# Patient Record
Sex: Female | Born: 1970 | Race: Black or African American | Hispanic: No | Marital: Married | Smoking: Never smoker
Health system: Southern US, Community
[De-identification: ages and names within clinical notes are randomized; demographics above are authoritative.]

## PROBLEM LIST (undated history)

## (undated) DIAGNOSIS — I10 Essential (primary) hypertension: Secondary | ICD-10-CM

## (undated) DIAGNOSIS — K219 Gastro-esophageal reflux disease without esophagitis: Secondary | ICD-10-CM

## (undated) DIAGNOSIS — Z8719 Personal history of other diseases of the digestive system: Secondary | ICD-10-CM

## (undated) HISTORY — PX: WISDOM TOOTH EXTRACTION: SHX21

## (undated) HISTORY — PX: DILATION AND CURETTAGE OF UTERUS: SHX78

## (undated) HISTORY — PX: BUNIONECTOMY: SHX129

---

## 1999-07-18 ENCOUNTER — Other Ambulatory Visit: Admission: RE | Admit: 1999-07-18 | Discharge: 1999-07-18 | Payer: Self-pay | Admitting: Internal Medicine

## 1999-09-03 ENCOUNTER — Encounter: Payer: Self-pay | Admitting: Internal Medicine

## 1999-09-03 ENCOUNTER — Ambulatory Visit (HOSPITAL_COMMUNITY): Admission: RE | Admit: 1999-09-03 | Discharge: 1999-09-03 | Payer: Self-pay | Admitting: Internal Medicine

## 2000-03-07 ENCOUNTER — Emergency Department (HOSPITAL_COMMUNITY): Admission: EM | Admit: 2000-03-07 | Discharge: 2000-03-07 | Payer: Self-pay | Admitting: Emergency Medicine

## 2000-03-19 ENCOUNTER — Encounter: Admission: RE | Admit: 2000-03-19 | Discharge: 2000-06-17 | Payer: Self-pay | Admitting: Internal Medicine

## 2000-03-29 ENCOUNTER — Ambulatory Visit (HOSPITAL_COMMUNITY): Admission: RE | Admit: 2000-03-29 | Discharge: 2000-03-29 | Payer: Self-pay | Admitting: Orthopedic Surgery

## 2000-03-29 ENCOUNTER — Encounter: Payer: Self-pay | Admitting: Orthopedic Surgery

## 2000-05-28 ENCOUNTER — Emergency Department (HOSPITAL_COMMUNITY): Admission: EM | Admit: 2000-05-28 | Discharge: 2000-05-28 | Payer: Self-pay | Admitting: Internal Medicine

## 2000-07-01 ENCOUNTER — Ambulatory Visit (HOSPITAL_COMMUNITY): Admission: RE | Admit: 2000-07-01 | Discharge: 2000-07-01 | Payer: Self-pay | Admitting: Orthopedic Surgery

## 2000-07-01 ENCOUNTER — Encounter: Payer: Self-pay | Admitting: Orthopedic Surgery

## 2000-07-31 ENCOUNTER — Ambulatory Visit (HOSPITAL_COMMUNITY): Admission: RE | Admit: 2000-07-31 | Discharge: 2000-07-31 | Payer: Self-pay | Admitting: Orthopedic Surgery

## 2000-07-31 ENCOUNTER — Encounter: Payer: Self-pay | Admitting: Orthopedic Surgery

## 2000-08-17 ENCOUNTER — Other Ambulatory Visit: Admission: RE | Admit: 2000-08-17 | Discharge: 2000-09-04 | Payer: Self-pay | Admitting: Family Medicine

## 2000-10-07 ENCOUNTER — Emergency Department (HOSPITAL_COMMUNITY): Admission: EM | Admit: 2000-10-07 | Discharge: 2000-10-07 | Payer: Self-pay | Admitting: Emergency Medicine

## 2000-10-20 ENCOUNTER — Encounter: Payer: Self-pay | Admitting: Emergency Medicine

## 2000-10-20 ENCOUNTER — Emergency Department (HOSPITAL_COMMUNITY): Admission: EM | Admit: 2000-10-20 | Discharge: 2000-10-20 | Payer: Self-pay | Admitting: Emergency Medicine

## 2001-01-14 ENCOUNTER — Emergency Department (HOSPITAL_COMMUNITY): Admission: EM | Admit: 2001-01-14 | Discharge: 2001-01-14 | Payer: Self-pay | Admitting: Emergency Medicine

## 2001-02-15 ENCOUNTER — Encounter: Payer: Self-pay | Admitting: Emergency Medicine

## 2001-02-15 ENCOUNTER — Emergency Department (HOSPITAL_COMMUNITY): Admission: EM | Admit: 2001-02-15 | Discharge: 2001-02-15 | Payer: Self-pay | Admitting: Emergency Medicine

## 2001-04-03 ENCOUNTER — Encounter: Payer: Self-pay | Admitting: Emergency Medicine

## 2001-04-03 ENCOUNTER — Emergency Department (HOSPITAL_COMMUNITY): Admission: EM | Admit: 2001-04-03 | Discharge: 2001-04-03 | Payer: Self-pay | Admitting: Emergency Medicine

## 2001-08-17 ENCOUNTER — Other Ambulatory Visit: Admission: RE | Admit: 2001-08-17 | Discharge: 2001-08-17 | Payer: Self-pay | Admitting: Internal Medicine

## 2002-11-10 ENCOUNTER — Emergency Department (HOSPITAL_COMMUNITY): Admission: EM | Admit: 2002-11-10 | Discharge: 2002-11-11 | Payer: Self-pay | Admitting: Emergency Medicine

## 2003-02-09 ENCOUNTER — Other Ambulatory Visit: Admission: RE | Admit: 2003-02-09 | Discharge: 2003-02-09 | Payer: Self-pay | Admitting: Family Medicine

## 2004-03-26 ENCOUNTER — Emergency Department (HOSPITAL_COMMUNITY): Admission: EM | Admit: 2004-03-26 | Discharge: 2004-03-26 | Payer: Self-pay | Admitting: *Deleted

## 2004-05-24 ENCOUNTER — Emergency Department (HOSPITAL_COMMUNITY): Admission: EM | Admit: 2004-05-24 | Discharge: 2004-05-24 | Payer: Self-pay | Admitting: Emergency Medicine

## 2005-12-01 HISTORY — PX: LAPAROTOMY: SHX154

## 2010-04-10 ENCOUNTER — Encounter: Admission: RE | Admit: 2010-04-10 | Discharge: 2010-04-10 | Payer: Self-pay | Admitting: Family Medicine

## 2012-11-07 ENCOUNTER — Ambulatory Visit (HOSPITAL_BASED_OUTPATIENT_CLINIC_OR_DEPARTMENT_OTHER): Payer: BC Managed Care – PPO | Attending: Family Medicine

## 2012-11-07 VITALS — Ht 68.0 in | Wt 216.0 lb

## 2012-11-07 DIAGNOSIS — G4733 Obstructive sleep apnea (adult) (pediatric): Secondary | ICD-10-CM | POA: Insufficient documentation

## 2012-11-13 DIAGNOSIS — G4761 Periodic limb movement disorder: Secondary | ICD-10-CM

## 2012-11-13 DIAGNOSIS — R0609 Other forms of dyspnea: Secondary | ICD-10-CM

## 2012-11-13 DIAGNOSIS — R0989 Other specified symptoms and signs involving the circulatory and respiratory systems: Secondary | ICD-10-CM

## 2012-11-13 DIAGNOSIS — G4733 Obstructive sleep apnea (adult) (pediatric): Secondary | ICD-10-CM

## 2012-11-13 NOTE — Procedures (Signed)
NAMECHEILA, Amy Morrison              ACCOUNT NO.:  [id number]  MEDICAL RECORD NO.:  [id number]          PATIENT TYPE:  OUT  LOCATION:  SLEEP CENTER                 FACILITY:  [facility]  PHYSICIAN:  Thelbert Gartin D. Maple Hudson, MD, FCCP, FACPDATE OF BIRTH:  03/05/1972  DATE OF STUDY:  11/07/2012                           NOCTURNAL POLYSOMNOGRAM  REFERRING PHYSICIAN:  Tanya D. Daphine Deutscher, M.D.  INDICATION FOR STUDY:  Hypersomnia with sleep apnea.  EPWORTH SLEEPINESS SCORE:  7/24.  BMI 33.  Weight 216 pounds.  Height 68 inches.  Neck 15 inches.  MEDICATIONS:  Home medications are charted and reviewed.  SLEEP ARCHITECTURE:  Total sleep time 363.5 minutes with sleep efficiency 83.7%.  Stage I was 5.2%, stage II 68.5%, stage III 7.7%, REM 18.6% of total sleep time.  Sleep latency 46.5 minutes.  REM latency 103 minutes.  Awake after sleep onset 25 minutes.  Arousal index 27.6.  BEDTIME MEDICATION:  None.  RESPIRATORY DATA:  Apnea-hypopnea index (AHI) 41.6 per hour.  A total of 252 events was scored including 201 obstructive apneas, 1 central apnea, 8 mixed apneas, 42 hypopneas.  Events were more common while supine, but seen in all sleep positions.  REM AHI 44.4 per hour.  There were insufficient numbers of early events to permit application of split protocol, CPAP titration on the study night.  OXYGEN DATA:  Very loud snoring with oxygen desaturation to a nadir of 86% and mean oxygen saturation through the study of 94.1% on room air.  CARDIAC DATA:  Normal sinus rhythm.  MOVEMENT-PARASOMNIA:  A total of 17 limb jerks were counted, of which 7 were associated with arousals or awakenings for periodic limb movement with arousal index of 1.2 per hour.  No bathroom trips.  IMPRESSIONS-RECOMMENDATIONS: 1. Severe obstructive sleep apnea/hypopnea syndrome, apnea-hypopnea index of     41.6 per hour.  Non-positional events.  Very loud snoring with     oxygen desaturation to a nadir of 86% and mean oxygen  saturation     through the study of 94.1% on room air. 2. There were insufficient numbers of early events to meet protocol     requirements for application of split     protocol, continuous positive airway pressure titration.  Consider     return for dedicated continuous positive airway pressure titration     study if appropriate.     Kendre Jacinto D. Maple Hudson, MD, [facility], FACP Diplomate, American Board of Sleep Medicine    CDY/MEDQ  D:  11/13/2012 11:14:14  T:  11/13/2012 12:21:25  Job:  [id number]

## 2013-01-28 ENCOUNTER — Other Ambulatory Visit: Payer: Self-pay | Admitting: Family Medicine

## 2013-01-28 ENCOUNTER — Ambulatory Visit
Admission: RE | Admit: 2013-01-28 | Discharge: 2013-01-28 | Disposition: A | Discharge status: Home / Self Care | Payer: BC Managed Care – PPO | Source: Ambulatory Visit | Attending: Family Medicine | Admitting: Family Medicine

## 2013-01-28 DIAGNOSIS — M25561 Pain in right knee: Secondary | ICD-10-CM

## 2013-01-28 DIAGNOSIS — R609 Edema, unspecified: Secondary | ICD-10-CM

## 2013-04-28 ENCOUNTER — Other Ambulatory Visit: Payer: Self-pay | Admitting: Obstetrics and Gynecology

## 2013-04-28 DIAGNOSIS — Z1231 Encounter for screening mammogram for malignant neoplasm of breast: Secondary | ICD-10-CM

## 2013-05-06 ENCOUNTER — Ambulatory Visit (HOSPITAL_COMMUNITY)
Admission: RE | Admit: 2013-05-06 | Discharge: 2013-05-06 | Disposition: A | Discharge status: Home / Self Care | Payer: BC Managed Care – PPO | Source: Ambulatory Visit | Attending: Obstetrics and Gynecology | Admitting: Obstetrics and Gynecology

## 2013-05-06 DIAGNOSIS — Z1231 Encounter for screening mammogram for malignant neoplasm of breast: Secondary | ICD-10-CM | POA: Insufficient documentation

## 2013-06-01 ENCOUNTER — Ambulatory Visit: Payer: BC Managed Care – PPO

## 2013-11-08 ENCOUNTER — Other Ambulatory Visit: Payer: Self-pay | Admitting: Obstetrics and Gynecology

## 2013-11-08 DIAGNOSIS — IMO0002 Reserved for concepts with insufficient information to code with codable children: Secondary | ICD-10-CM

## 2013-11-15 ENCOUNTER — Ambulatory Visit (HOSPITAL_COMMUNITY)
Admission: RE | Admit: 2013-11-15 | Discharge: 2013-11-15 | Disposition: A | Discharge status: Home / Self Care | Payer: BC Managed Care – PPO | Source: Ambulatory Visit | Attending: Obstetrics and Gynecology | Admitting: Obstetrics and Gynecology

## 2013-11-15 DIAGNOSIS — D259 Leiomyoma of uterus, unspecified: Secondary | ICD-10-CM | POA: Insufficient documentation

## 2013-11-15 DIAGNOSIS — IMO0002 Reserved for concepts with insufficient information to code with codable children: Secondary | ICD-10-CM

## 2013-11-15 DIAGNOSIS — N979 Female infertility, unspecified: Secondary | ICD-10-CM | POA: Insufficient documentation

## 2013-11-15 MED ORDER — IOHEXOL 300 MG/ML  SOLN
20.0000 mL | Freq: Once | INTRAMUSCULAR | Status: AC | PRN
  Administered 2013-11-15: 20 mL

## 2014-08-03 ENCOUNTER — Other Ambulatory Visit (HOSPITAL_COMMUNITY): Payer: Self-pay | Admitting: Family Medicine

## 2014-08-03 DIAGNOSIS — Z1231 Encounter for screening mammogram for malignant neoplasm of breast: Secondary | ICD-10-CM

## 2014-08-11 ENCOUNTER — Ambulatory Visit (HOSPITAL_COMMUNITY)
Admission: RE | Admit: 2014-08-11 | Discharge: 2014-08-11 | Disposition: A | Discharge status: Home / Self Care | Payer: BC Managed Care – PPO | Source: Ambulatory Visit | Attending: Family Medicine | Admitting: Family Medicine

## 2014-08-11 DIAGNOSIS — Z1231 Encounter for screening mammogram for malignant neoplasm of breast: Secondary | ICD-10-CM | POA: Diagnosis present

## 2014-12-05 ENCOUNTER — Other Ambulatory Visit: Payer: Self-pay | Admitting: Obstetrics and Gynecology

## 2014-12-06 NOTE — H&P (Signed)
   Reason for admission:   Sub mucosal fibroids   History:     Amy Morrison is a 44 y.o. female, G0., presenting today to undergo hysteroscopic myomectomy. Patient is seeking pregnancy with a normal AMH, appropriately corrected anovulation with Clomid 150 mg  And female infertility under the care of a urologist. She has failed her 1st IUI and recently had a saline infusion sonogram Which revealed 4 submucosal fibroids with the largest being fundal and 3.1 cm   Review of system:  Non-contributory   Past Medical History:   Hypertension now on HCTZ 25 mg only ( recently d/c Procardia) Morbid obesity with BMI 46 Myomectomy 2007: 10 fibroids removed  Allergies:   NKDA  Social History:    Married and non-smoker. Works as a Holiday representative  Family History:   Maternal and paternal grandparents with hypertension Mother with diabetes Father with colon and prostate cancer  Physical exam:    General Appearance: Alert, appropriate appearance for age. No acute distress Neck / Thyroid: Supple, no masses, nodes or enlargement Lungs: clear to auscultation bilaterally Back: No CVA tenderness. Cardiovascular: Regular rate and rhythm. S1, S2, no murmur Gastrointestinal: Soft, non-tender, no masses or organomegaly Pelvic Exam: Vulva and vagina appear normal. Bimanual exam reveals normal uterus and adnexa.   Assessment:   Submucosal fibroids in a patient pursuing pregnancy   Plan:    Proceed with hysteroscopic myomectomy Procedure, risks and benefits reviewed with patient including but not limited to bleeding, infection, uterine perforation with possible intraabdominal organ damage and need for laparotomy. Patient voices understanding and is agreeable to proceed.      Delsa Bern A MD

## 2014-12-14 ENCOUNTER — Other Ambulatory Visit: Payer: Self-pay | Admitting: Obstetrics and Gynecology

## 2014-12-18 ENCOUNTER — Encounter (HOSPITAL_COMMUNITY): Payer: Self-pay

## 2014-12-18 ENCOUNTER — Other Ambulatory Visit: Payer: Self-pay

## 2014-12-18 ENCOUNTER — Encounter (HOSPITAL_COMMUNITY)
Admission: RE | Admit: 2014-12-18 | Discharge: 2014-12-18 | Disposition: A | Discharge status: Home / Self Care | Payer: BLUE CROSS/BLUE SHIELD | Source: Ambulatory Visit | Attending: Obstetrics and Gynecology | Admitting: Obstetrics and Gynecology

## 2014-12-18 DIAGNOSIS — Z01818 Encounter for other preprocedural examination: Secondary | ICD-10-CM | POA: Insufficient documentation

## 2014-12-18 DIAGNOSIS — D250 Submucous leiomyoma of uterus: Secondary | ICD-10-CM | POA: Diagnosis not present

## 2014-12-18 HISTORY — DX: Gastro-esophageal reflux disease without esophagitis: K21.9

## 2014-12-18 HISTORY — DX: Essential (primary) hypertension: I10

## 2014-12-18 LAB — CBC
HEMATOCRIT: 38.3 % (ref 36.0–46.0)
Hemoglobin: 12.8 g/dL (ref 12.0–15.0)
MCH: 26 pg (ref 26.0–34.0)
MCHC: 33.4 g/dL (ref 30.0–36.0)
MCV: 77.8 fL — AB (ref 78.0–100.0)
PLATELETS: 214 10*3/uL (ref 150–400)
RBC: 4.92 MIL/uL (ref 3.87–5.11)
RDW: 15.1 % (ref 11.5–15.5)
WBC: 5.3 10*3/uL (ref 4.0–10.5)

## 2014-12-18 LAB — BASIC METABOLIC PANEL
ANION GAP: 6 (ref 5–15)
BUN: 9 mg/dL (ref 6–23)
CHLORIDE: 106 meq/L (ref 96–112)
CO2: 29 mmol/L (ref 19–32)
CREATININE: 0.9 mg/dL (ref 0.50–1.10)
Calcium: 9.1 mg/dL (ref 8.4–10.5)
GFR calc Af Amer: >90 mL/min (ref >90)
GFR calc non Af Amer: 78 mL/min — ABNORMAL LOW (ref >90)
GLUCOSE: 109 mg/dL — AB (ref 70–99)
Potassium: 4 mmol/L (ref 3.5–5.1)
SODIUM: 141 mmol/L (ref 135–145)

## 2014-12-18 LAB — TYPE AND SCREEN
ABO/RH(D): A POS
ANTIBODY SCREEN: NEGATIVE

## 2014-12-18 LAB — ABO/RH: ABO/RH(D): A POS

## 2014-12-18 NOTE — Patient Instructions (Addendum)
   Your procedure is scheduled on:  Friday, Jan 22  Enter through the Main Entrance of [facility] at: 7:30 AM Pick up the phone at the desk and dial [phone] and inform us of your arrival.  Please call this number if you have any problems the morning of surgery: [phone]  Remember: Do not eat or drink after midnight: Thursday Take these medicines the morning of surgery with a SIP OF WATER: ranitidine, triam/hctz   Do not wear jewelry, make-up, or FINGER nail polish No metal in your hair or on your body. Do not wear lotions, powders, perfumes.  You may wear deodorant.  Do not bring valuables to the hospital. Contacts, dentures or bridgework may not be worn into surgery.  Patients discharged on the day of surgery will not be allowed to drive home.  Home with husband Marchia Bond cell [phone].

## 2014-12-21 MED ORDER — DEXTROSE 5 % IV SOLN
3.0000 g | INTRAVENOUS | Status: AC
  Administered 2014-12-22: 3 g via INTRAVENOUS
  Filled 2014-12-21: qty 3000

## 2014-12-22 ENCOUNTER — Ambulatory Visit (HOSPITAL_COMMUNITY): Payer: BLUE CROSS/BLUE SHIELD | Admitting: Certified Registered Nurse Anesthetist

## 2014-12-22 ENCOUNTER — Encounter (HOSPITAL_COMMUNITY): Payer: Self-pay | Admitting: Certified Registered Nurse Anesthetist

## 2014-12-22 ENCOUNTER — Ambulatory Visit (HOSPITAL_COMMUNITY)
Admission: RE | Admit: 2014-12-22 | Discharge: 2014-12-22 | Disposition: A | Discharge status: Home / Self Care | Payer: BLUE CROSS/BLUE SHIELD | Source: Ambulatory Visit | Attending: Obstetrics and Gynecology | Admitting: Obstetrics and Gynecology

## 2014-12-22 ENCOUNTER — Encounter (HOSPITAL_COMMUNITY)
Admission: RE | Disposition: A | Discharge status: Home / Self Care | Payer: Self-pay | Source: Ambulatory Visit | Attending: Obstetrics and Gynecology

## 2014-12-22 DIAGNOSIS — Z6841 Body Mass Index (BMI) 40.0 and over, adult: Secondary | ICD-10-CM | POA: Diagnosis not present

## 2014-12-22 DIAGNOSIS — I10 Essential (primary) hypertension: Secondary | ICD-10-CM | POA: Insufficient documentation

## 2014-12-22 DIAGNOSIS — D250 Submucous leiomyoma of uterus: Secondary | ICD-10-CM | POA: Diagnosis present

## 2014-12-22 DIAGNOSIS — N840 Polyp of corpus uteri: Secondary | ICD-10-CM | POA: Diagnosis not present

## 2014-12-22 DIAGNOSIS — K219 Gastro-esophageal reflux disease without esophagitis: Secondary | ICD-10-CM | POA: Diagnosis not present

## 2014-12-22 HISTORY — PX: DILATATION & CURETTAGE/HYSTEROSCOPY WITH MYOSURE: SHX6511

## 2014-12-22 LAB — PREGNANCY, URINE: Preg Test, Ur: NEGATIVE

## 2014-12-22 SURGERY — DILATATION & CURETTAGE/HYSTEROSCOPY WITH MYOSURE
Anesthesia: General | Site: Uterus

## 2014-12-22 MED ORDER — ONDANSETRON HCL 4 MG/2ML IJ SOLN
INTRAMUSCULAR | Status: DC | PRN
  Administered 2014-12-22: 4 mg via INTRAVENOUS

## 2014-12-22 MED ORDER — FENTANYL CITRATE 0.05 MG/ML IJ SOLN
INTRAMUSCULAR | Status: DC | PRN
  Administered 2014-12-22: 50 ug via INTRAVENOUS
  Administered 2014-12-22 (×2): 25 ug via INTRAVENOUS

## 2014-12-22 MED ORDER — PROPOFOL 10 MG/ML IV BOLUS
INTRAVENOUS | Status: AC
  Filled 2014-12-22: qty 20

## 2014-12-22 MED ORDER — KETOROLAC TROMETHAMINE 30 MG/ML IJ SOLN
INTRAMUSCULAR | Status: DC | PRN
  Administered 2014-12-22: 30 mg via INTRAVENOUS

## 2014-12-22 MED ORDER — HYDROCODONE-ACETAMINOPHEN 5-300 MG PO TABS: 1.0000 | ORAL_TABLET | Freq: Four times a day (QID) | ORAL | Status: DC | PRN

## 2014-12-22 MED ORDER — HYDROMORPHONE HCL 1 MG/ML IJ SOLN
INTRAMUSCULAR | Status: AC
  Filled 2014-12-22: qty 1

## 2014-12-22 MED ORDER — MIDAZOLAM HCL 2 MG/2ML IJ SOLN
INTRAMUSCULAR | Status: AC
  Filled 2014-12-22: qty 2

## 2014-12-22 MED ORDER — DEXAMETHASONE SODIUM PHOSPHATE 10 MG/ML IJ SOLN
INTRAMUSCULAR | Status: DC | PRN
  Administered 2014-12-22: 4 mg via INTRAVENOUS

## 2014-12-22 MED ORDER — LIDOCAINE HCL (CARDIAC) 20 MG/ML IV SOLN
INTRAVENOUS | Status: AC
  Filled 2014-12-22: qty 5

## 2014-12-22 MED ORDER — HYDROMORPHONE HCL 1 MG/ML IJ SOLN
INTRAMUSCULAR | Status: DC | PRN
  Administered 2014-12-22 (×2): 0.5 mg via INTRAVENOUS

## 2014-12-22 MED ORDER — SCOPOLAMINE 1 MG/3DAYS TD PT72
1.0000 | MEDICATED_PATCH | Freq: Once | TRANSDERMAL | Status: DC
  Administered 2014-12-22: 1.5 mg via TRANSDERMAL

## 2014-12-22 MED ORDER — IBUPROFEN 600 MG PO TABS: 600.0000 mg | ORAL_TABLET | Freq: Four times a day (QID) | ORAL | Status: DC | PRN

## 2014-12-22 MED ORDER — PROPOFOL 10 MG/ML IV BOLUS
INTRAVENOUS | Status: DC | PRN
  Administered 2014-12-22: 180 mg via INTRAVENOUS

## 2014-12-22 MED ORDER — CHLOROPROCAINE HCL 1 % IJ SOLN
INTRAMUSCULAR | Status: AC
  Filled 2014-12-22: qty 30

## 2014-12-22 MED ORDER — ONDANSETRON HCL 4 MG/2ML IJ SOLN
INTRAMUSCULAR | Status: AC
Start: 2014-12-22 — End: 2014-12-22
  Filled 2014-12-22: qty 2

## 2014-12-22 MED ORDER — CHLOROPROCAINE HCL 1 % IJ SOLN
INTRAMUSCULAR | Status: DC | PRN
  Administered 2014-12-22: 20 mL

## 2014-12-22 MED ORDER — SCOPOLAMINE 1 MG/3DAYS TD PT72
MEDICATED_PATCH | TRANSDERMAL | Status: AC
  Administered 2014-12-22: 1.5 mg via TRANSDERMAL
  Filled 2014-12-22: qty 1

## 2014-12-22 MED ORDER — LIDOCAINE HCL (CARDIAC) 20 MG/ML IV SOLN
INTRAVENOUS | Status: DC | PRN
  Administered 2014-12-22: 80 mg via INTRAVENOUS

## 2014-12-22 MED ORDER — LACTATED RINGERS IV SOLN
INTRAVENOUS | Status: DC
Start: 2014-12-22 — End: 2014-12-22
  Administered 2014-12-22 (×2): via INTRAVENOUS

## 2014-12-22 MED ORDER — HYDROCODONE-ACETAMINOPHEN 5-325 MG PO TABS
1.0000 | ORAL_TABLET | Freq: Once | ORAL | Status: AC
  Administered 2014-12-22: 1 via ORAL

## 2014-12-22 MED ORDER — KETOROLAC TROMETHAMINE 30 MG/ML IJ SOLN
INTRAMUSCULAR | Status: AC
  Filled 2014-12-22: qty 1

## 2014-12-22 MED ORDER — DEXAMETHASONE SODIUM PHOSPHATE 4 MG/ML IJ SOLN
INTRAMUSCULAR | Status: AC
  Filled 2014-12-22: qty 1

## 2014-12-22 MED ORDER — MIDAZOLAM HCL 2 MG/2ML IJ SOLN
INTRAMUSCULAR | Status: DC | PRN
  Administered 2014-12-22: 2 mg via INTRAVENOUS

## 2014-12-22 MED ORDER — FENTANYL CITRATE 0.05 MG/ML IJ SOLN
INTRAMUSCULAR | Status: AC
  Filled 2014-12-22: qty 2

## 2014-12-22 MED ORDER — HYDROCODONE-ACETAMINOPHEN 5-325 MG PO TABS
ORAL_TABLET | ORAL | Status: AC
  Filled 2014-12-22: qty 1

## 2014-12-22 SURGICAL SUPPLY — 19 items
BOOTIES KNEE HIGH SLOAN (MISCELLANEOUS) ×6 IMPLANT
CANISTER SUCT 3000ML (MISCELLANEOUS) ×3 IMPLANT
CATH ROBINSON RED A/P 16FR (CATHETERS) ×3 IMPLANT
CLOTH BEACON ORANGE TIMEOUT ST (SAFETY) ×3 IMPLANT
CONTAINER PREFILL 10% NBF 60ML (FORM) ×6 IMPLANT
ELECT REM PT RETURN 9FT ADLT (ELECTROSURGICAL) ×3
ELECTRODE REM PT RTRN 9FT ADLT (ELECTROSURGICAL) ×1 IMPLANT
GLOVE BIOGEL PI IND STRL 7.0 (GLOVE) ×1 IMPLANT
GLOVE BIOGEL PI INDICATOR 7.0 (GLOVE) ×8
GLOVE SURG SS PI 7.0 STRL IVOR (GLOVE) ×6 IMPLANT
GOWN STRL REUS W/TWL LRG LVL3 (GOWN DISPOSABLE) ×6 IMPLANT
MYOSURE XL FIBROID REM (MISCELLANEOUS) ×3
PACK VAGINAL MINOR WOMEN LF (CUSTOM PROCEDURE TRAY) ×3 IMPLANT
PAD OB MATERNITY 4.3X12.25 (PERSONAL CARE ITEMS) ×3 IMPLANT
SYSTEM TISS REMOVAL MYSR XL RM (MISCELLANEOUS) IMPLANT
TOWEL OR 17X24 6PK STRL BLUE (TOWEL DISPOSABLE) ×3 IMPLANT
TUBING AQUILEX INFLOW (TUBING) ×3 IMPLANT
TUBING AQUILEX OUTFLOW (TUBING) ×3 IMPLANT
WATER STERILE IRR 1000ML POUR (IV SOLUTION) ×3 IMPLANT

## 2014-12-22 NOTE — Anesthesia Preprocedure Evaluation (Addendum)
Anesthesia Evaluation  Patient identified by MRN, date of birth, ID band Patient awake    Reviewed: Allergy & Precautions, H&P , Patient's Chart, lab work & pertinent test results, reviewed documented beta blocker date and time   History of Anesthesia Complications Negative for: history of anesthetic complications  Airway Mallampati: III  TM Distance: >3 FB Neck ROM: full    Dental   Pulmonary  breath sounds clear to auscultation        Cardiovascular Exercise Tolerance: Good hypertension, Rhythm:regular Rate:Normal     Neuro/Psych negative psych ROS   GI/Hepatic GERD-  Controlled,  Endo/Other    Renal/GU      Musculoskeletal   Abdominal   Peds  Hematology   Anesthesia Other Findings   Reproductive/Obstetrics                            Anesthesia Physical Anesthesia Plan  ASA: III  Anesthesia Plan: General LMA   Post-op Pain Management:    Induction:   Airway Management Planned:   Additional Equipment:   Intra-op Plan:   Post-operative Plan:   Informed Consent: I have reviewed the patients History and Physical, chart, labs and discussed the procedure including the risks, benefits and alternatives for the proposed anesthesia with the patient or authorized representative who has indicated his/her understanding and acceptance.   Dental Advisory Given  Plan Discussed with: CRNA, Surgeon and Anesthesiologist  Anesthesia Plan Comments:        Anesthesia Quick Evaluation

## 2014-12-22 NOTE — Transfer of Care (Signed)
Immediate Anesthesia Transfer of Care Note  Patient: Amy Morrison  Procedure(s) Performed: Procedure(s) with comments: DILATATION & CURETTAGE/HYSTEROSCOPY WITH MYOSURE,POSSIBLE VERSAPOINT (N/A) - POSSIBLE VERSAPOINT  Patient Location: PACU  Anesthesia Type:General  Level of Consciousness: awake, alert , oriented and patient cooperative  Airway & Oxygen Therapy: Patient Spontanous Breathing and Patient connected to nasal cannula oxygen  Post-op Assessment: Report given to PACU RN and Post -op Vital signs reviewed and stable  Post vital signs: Reviewed and stable  Complications: No apparent anesthesia complications

## 2014-12-22 NOTE — Op Note (Signed)
Preop diagnosis: submucosal fibroid  Postop diagnosis: same with endometrial polyp  Anesthesia: general  Anesthesiologist: Dr. Rudean Curt  Procedure: Hysteroscopy, resection of endometrial polyps and myomectomy  Surgeon: Dr. Katharine Look Kendell Sagraves  Procedure: After being informed of the planned procedure with possible complications including bleeding, infection and uterine perforation, informed consent was obtained and patient was taken to or #4.  She was given general anesthesia without complication. She was placed in a dorsal decubitus position, prepped and draped in the sterile fashion and  her bladder was emptied with a red rubber catheter. Pelvic exam reveals anteverted uterus which is enlarged with fibroids to a 14 week size. Both adnexa are normal.  A weighted speculum is inserted in the vagina. The cervix was grasped with a tenaculum forcep placed on the anterior lip.We proceed with a paracervical block using 1% Nesacaine, 20 cc. Uterus is sounded at 12. The cervix is then easily dilated using Hegar dilator until # 25. This allows for easy placement of a diagnostic hysteroscope. With perfusion of Normal Saline at a maximum pressure of 100 mmHg, we are able to evaluate the entire uterine cavity.  Observation: Fundus and tubal ostia are normal. In the lower 1/3 of the uterus, we identify a 2 cm endometrial polyp and a 1.5 cm submucosal fibroid.   Using Myosure XL, we proceed with the resection of both without difficulty.   Instruments are then removed. Instrument and sponge count is complete x2. Estimated blood loss is minimal. Water deficit is 200 cc of Normal saline.A 3-0 Vicryl simple stitch is place on the anterior lip of the cervix for hemostasis.  The procedure is very well tolerated by the patient who is taken to recovery room in a well and stable condition.  Specimen: Endometrial polyps and fibroid sent to pathology.

## 2014-12-22 NOTE — H&P (View-Only) (Signed)
   Reason for admission:   Sub mucosal fibroids   History:     Amy Morrison is a 44 y.o. female, G0., presenting today to undergo hysteroscopic myomectomy. Patient is seeking pregnancy with a normal AMH, appropriately corrected anovulation with Clomid 150 mg  And female infertility under the care of a urologist. She has failed her 1st IUI and recently had a saline infusion sonogram Which revealed 4 submucosal fibroids with the largest being fundal and 3.1 cm   Review of system:  Non-contributory   Past Medical History:   Hypertension now on HCTZ 25 mg only ( recently d/c Procardia) Morbid obesity with BMI 46 Myomectomy 2007: 10 fibroids removed  Allergies:   NKDA  Social History:    Married and non-smoker. Works as a Holiday representative  Family History:   Maternal and paternal grandparents with hypertension Mother with diabetes Father with colon and prostate cancer  Physical exam:    General Appearance: Alert, appropriate appearance for age. No acute distress Neck / Thyroid: Supple, no masses, nodes or enlargement Lungs: clear to auscultation bilaterally Back: No CVA tenderness. Cardiovascular: Regular rate and rhythm. S1, S2, no murmur Gastrointestinal: Soft, non-tender, no masses or organomegaly Pelvic Exam: Vulva and vagina appear normal. Bimanual exam reveals normal uterus and adnexa.   Assessment:   Submucosal fibroids in a patient pursuing pregnancy   Plan:    Proceed with hysteroscopic myomectomy Procedure, risks and benefits reviewed with patient including but not limited to bleeding, infection, uterine perforation with possible intraabdominal organ damage and need for laparotomy. Patient voices understanding and is agreeable to proceed.      Delsa Bern A MD

## 2014-12-22 NOTE — Discharge Instructions (Signed)
POST-OPERATIVE INSTRUCTIONS TO PATIENT  Call [facility]  [phone])  for excessive pain, bleeding or temperature greater than or equal to 100.4 degrees (orally).    No driving for 1 day No sexual activity until bleeding has stopped No tampon for 3 days  Pain management:  Use Ibuprofen 600 mg every 6 hours for 5 days and then as needed. Use your pain medication as needed to maintain a pain level at or below 3/10 Use Colace 1-2 capsules per day as long as you are using pain medication to avoid constipation.       Diet: normal  Bathing: may shower day after surgery  Wound Care: N/A  Return to Dr. Cletis Media on 01/02/15 at 10:45  Return to work: Monday 12/25/14    Kariann Wecker A MD 12/22/2014 10:22 AM

## 2014-12-22 NOTE — Interval H&P Note (Signed)
History and Physical Interval Note:  12/22/2014 9:04 AM  Amy Morrison  has presented today for surgery, with the diagnosis of Submucosal Fibroid  The various methods of treatment have been discussed with the patient and family. After consideration of risks, benefits and other options for treatment, the patient has consented to  Procedure(s) with comments: [place] (N/A) - POSSIBLE VERSAPOINT as a surgical intervention .  The patient's history has been reviewed, patient examined, no change in status, stable for surgery.  I have reviewed the patient's chart and labs.  Questions were answered to the patient's satisfaction.     Amy Morrison A

## 2014-12-22 NOTE — Anesthesia Postprocedure Evaluation (Signed)
Anesthesia Post Note  Patient: Amy Morrison  Procedure(s) Performed: Procedure(s) (LRB): DILATATION & CURETTAGE/HYSTEROSCOPY WITH MYOSURE (N/A)  Anesthesia type: GA  Patient location: PACU  Post pain: Pain level controlled  Post assessment: Post-op Vital signs reviewed  Last Vitals:  Filed Vitals:   12/22/14 1017  BP: 115/74  Pulse: 81  Temp: 37.1 C  Resp: 20    Post vital signs: Reviewed  Level of consciousness: sedated  Complications: No apparent anesthesia complications

## 2014-12-22 NOTE — Anesthesia Procedure Notes (Signed)
Procedure Name: LMA Insertion Date/Time: 12/22/2014 9:19 AM Performed by: Raenette Rover Pre-anesthesia Checklist: Patient identified, Emergency Drugs available, Suction available and Patient being monitored Patient Re-evaluated:Patient Re-evaluated prior to inductionOxygen Delivery Method: Circle system utilized Preoxygenation: Pre-oxygenation with 100% oxygen Intubation Type: IV induction Ventilation: Mask ventilation without difficulty LMA: LMA with gastric port inserted LMA Size: 4.0 Number of attempts: 1 Airway Equipment and Method: Patient positioned with wedge pillow Placement Confirmation: positive ETCO2,  CO2 detector and breath sounds checked- equal and bilateral Tube secured with: Tape Dental Injury: Teeth and Oropharynx as per pre-operative assessment

## 2014-12-25 ENCOUNTER — Encounter (HOSPITAL_COMMUNITY): Payer: Self-pay | Admitting: Obstetrics and Gynecology

## 2016-01-25 ENCOUNTER — Other Ambulatory Visit: Payer: Self-pay | Admitting: Obstetrics and Gynecology

## 2016-02-08 ENCOUNTER — Encounter (HOSPITAL_COMMUNITY)
Admission: RE | Admit: 2016-02-08 | Discharge: 2016-02-08 | Disposition: A | Discharge status: Home / Self Care | Payer: BLUE CROSS/BLUE SHIELD | Source: Ambulatory Visit | Attending: Obstetrics and Gynecology | Admitting: Obstetrics and Gynecology

## 2016-02-08 ENCOUNTER — Other Ambulatory Visit: Payer: Self-pay

## 2016-02-08 ENCOUNTER — Encounter (HOSPITAL_COMMUNITY): Payer: Self-pay

## 2016-02-08 ENCOUNTER — Other Ambulatory Visit (HOSPITAL_COMMUNITY): Payer: Self-pay | Admitting: Obstetrics and Gynecology

## 2016-02-08 DIAGNOSIS — Z01810 Encounter for preprocedural cardiovascular examination: Secondary | ICD-10-CM | POA: Insufficient documentation

## 2016-02-08 DIAGNOSIS — I10 Essential (primary) hypertension: Secondary | ICD-10-CM | POA: Insufficient documentation

## 2016-02-08 DIAGNOSIS — Z01812 Encounter for preprocedural laboratory examination: Secondary | ICD-10-CM | POA: Diagnosis not present

## 2016-02-08 HISTORY — DX: Personal history of other diseases of the digestive system: Z87.19

## 2016-02-08 LAB — BASIC METABOLIC PANEL
ANION GAP: 8 (ref 5–15)
BUN: 11 mg/dL (ref 6–20)
CO2: 25 mmol/L (ref 22–32)
Calcium: 8.7 mg/dL — ABNORMAL LOW (ref 8.9–10.3)
Chloride: 108 mmol/L (ref 101–111)
Creatinine, Ser: 0.77 mg/dL (ref 0.44–1.00)
GFR calc Af Amer: >60 mL/min (ref >60)
GLUCOSE: 85 mg/dL (ref 65–99)
Potassium: 3.7 mmol/L (ref 3.5–5.1)
Sodium: 141 mmol/L (ref 135–145)

## 2016-02-08 LAB — CBC
HCT: 32.8 % — ABNORMAL LOW (ref 36.0–46.0)
HEMOGLOBIN: 10.4 g/dL — AB (ref 12.0–15.0)
MCH: 21.9 pg — AB (ref 26.0–34.0)
MCHC: 31.7 g/dL (ref 30.0–36.0)
MCV: 69.1 fL — ABNORMAL LOW (ref 78.0–100.0)
Platelets: 268 10*3/uL (ref 150–400)
RBC: 4.75 MIL/uL (ref 3.87–5.11)
RDW: 17.3 % — AB (ref 11.5–15.5)
WBC: 6 10*3/uL (ref 4.0–10.5)

## 2016-02-08 NOTE — Patient Instructions (Addendum)
Your procedure is scheduled on: Thursday February 14, 2016    Enter through the Main Entrance of [facility] at: 11:30 am   Pick up the phone at the desk and dial [phone].  Call this number if you have problems the morning of surgery: [phone].  Remember: Do NOT eat food: after midnight on Wednesday  Do NOT drink clear liquids after: 9:00 am day of surgery  Take these medicines the morning of surgery with a SIP OF WATER: protonix and maxide   Do NOT wear jewelry (body piercing), metal hair clips/bobby pins, make-up, or nail polish. Do NOT wear lotions, powders, or perfumes.  You may wear deoderant. Do NOT shave for 48 hours prior to surgery. Do NOT bring valuables to the hospital. Contacts, dentures, or bridgework may not be worn into surgery. Leave suitcase in car.  After surgery it may be brought to your room.  For patients admitted to the hospital, checkout time is 11:00 AM the day of discharge.

## 2016-02-08 NOTE — H&P (Signed)
Amy Morrison is a 45 y.o.  female G:0 who desires pregnancy and  presents for myomectomy because of a large fibroid containing uterus.  The patient underwent myomectomy 9 yeas ago with the removal of 10 fibroids.  She has been married for four years and though she has not used contraception,  she has not conceived.  Her menstrual flow is monthly with pad change 6 times during the day and twice at night.  Fortunately she doesn't complain of any significant cramping.  She goes on to deny inter-menstrual bleeding, post-coital bleeding,  dyspareunia or changes in bowel or bladder function.  A pelvic ultrasound in January 2016 showed a uterus: 18.7 x 6.65 x 9.61 cm (retroflexed) with #3 fibroids: fundal, right sub-serosal: 6.75 x 6.79 x 8.13 cm; left intramural: 6.66 x 5.38 x 6.45 cm and posterior sub-serosal: 4.67 x 4.83 x 5.64 cm;  there was limited visualization of the myometrium and endometrium due to the fibroids;  right ovary-normal appearing and left ovary was  not visualized.  In January 2016 endometrial curettings returned endometrial polyp with fragments of endometrial polyp, no atypia ro malignancy; fragments of late proliferative early secretory phase endometrium with fractures of breakdown; no malignancy or hyperplasia.  In light of the patient's desire for pregnancy,  the size of her fibroids and heavy menstrual flow,  she has decided to proceed with an abdominal myomectomy.   Past Medical History  OB History: G:0  GYN History: menarche: 45 YO    LMP: 02/02/16    Contracepton: none, desires pregnancy;     Denies history of abnormal PAP smear.   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   ROS: Admits to glasses;  Denies headache, vision changes, nasal congestion, dysphagia, tinnitus, dizziness, hoarseness, cough,  chest pain, shortness of breath, nausea, vomiting, diarrhea,constipation,  urinary frequency, urgency  dysuria, hematuria, vaginitis symptoms, pelvic pain, swelling of joints,easy bruising,  myalgias, arthralgias, skin rashes, unexplained weight loss and except as is mentioned in the history of present illness, patient's review of systems is otherwise negative.   Physical Exam  Bp: 120/86   P: 76   R: 20   Temperature:  98.7 degrees F orally  Weight: 311 lbs.  Height: 5'8"  BMI: 47.3  Neck: supple without masses or thyromegaly Lungs: clear to auscultation Heart: regular rate and rhythm Abdomen: firm mass from pelvis to 3 fingers below umbilicus and is tender  Pelvic:EGBUS- wnl; vagina-normal rugae; uterus-tender and 20-22 weeks size, irregularly shaped, cervix without lesions or motion tenderness; adnexae-no tenderness or masses Extremities:  no clubbing, cyanosis or edema   Assesment: Uterine Fibroids           Menorrhagia           Infertility   Disposition:  Reviewed the risks  of surgery to include, but not limited to: reaction to anesthesia, damage to adjacent organs, infection, excessive bleeding and if the endometrial cavity is breached a C-section delivery will be required. The patient verbalized understanding of these risks and has consented to proceed with Abdominal  Myomectomy at [facility] on February 14, 2016 1 p.m.   CSN# [id number]   Shyasia Funches J. Florene Glen, PA-C  for Dr. Dede Query. Rivard

## 2016-02-13 MED ORDER — DEXTROSE 5 % IV SOLN
2.0000 g | INTRAVENOUS | Status: AC
  Administered 2016-02-14: 2 g via INTRAVENOUS
  Filled 2016-02-13: qty 2

## 2016-02-14 ENCOUNTER — Encounter (HOSPITAL_COMMUNITY)
Admission: RE | Disposition: A | Discharge status: Home / Self Care | Payer: Self-pay | Source: Ambulatory Visit | Attending: Obstetrics and Gynecology

## 2016-02-14 ENCOUNTER — Inpatient Hospital Stay (HOSPITAL_COMMUNITY): Payer: BLUE CROSS/BLUE SHIELD | Admitting: Anesthesiology

## 2016-02-14 ENCOUNTER — Inpatient Hospital Stay (HOSPITAL_COMMUNITY)
Admission: RE | Admit: 2016-02-14 | Discharge: 2016-02-17 | DRG: 742 | Disposition: A | Discharge status: Home / Self Care | Payer: BLUE CROSS/BLUE SHIELD | Source: Ambulatory Visit | Attending: Obstetrics and Gynecology | Admitting: Obstetrics and Gynecology

## 2016-02-14 ENCOUNTER — Encounter (HOSPITAL_COMMUNITY): Payer: Self-pay | Admitting: Emergency Medicine

## 2016-02-14 DIAGNOSIS — D252 Subserosal leiomyoma of uterus: Secondary | ICD-10-CM | POA: Diagnosis present

## 2016-02-14 DIAGNOSIS — D649 Anemia, unspecified: Secondary | ICD-10-CM | POA: Diagnosis present

## 2016-02-14 DIAGNOSIS — D219 Benign neoplasm of connective and other soft tissue, unspecified: Secondary | ICD-10-CM | POA: Diagnosis present

## 2016-02-14 DIAGNOSIS — K219 Gastro-esophageal reflux disease without esophagitis: Secondary | ICD-10-CM | POA: Diagnosis present

## 2016-02-14 DIAGNOSIS — D251 Intramural leiomyoma of uterus: Secondary | ICD-10-CM | POA: Diagnosis present

## 2016-02-14 DIAGNOSIS — Z6841 Body Mass Index (BMI) 40.0 and over, adult: Secondary | ICD-10-CM | POA: Diagnosis not present

## 2016-02-14 DIAGNOSIS — E282 Polycystic ovarian syndrome: Secondary | ICD-10-CM | POA: Diagnosis present

## 2016-02-14 DIAGNOSIS — I10 Essential (primary) hypertension: Secondary | ICD-10-CM | POA: Diagnosis present

## 2016-02-14 DIAGNOSIS — N920 Excessive and frequent menstruation with regular cycle: Secondary | ICD-10-CM | POA: Diagnosis present

## 2016-02-14 DIAGNOSIS — N979 Female infertility, unspecified: Secondary | ICD-10-CM | POA: Diagnosis present

## 2016-02-14 DIAGNOSIS — D259 Leiomyoma of uterus, unspecified: Secondary | ICD-10-CM | POA: Diagnosis present

## 2016-02-14 HISTORY — PX: MYOMECTOMY: SHX85

## 2016-02-14 LAB — CBC
HEMATOCRIT: 28.9 % — AB (ref 36.0–46.0)
HEMATOCRIT: 31.7 % — AB (ref 36.0–46.0)
Hemoglobin: 9.2 g/dL — ABNORMAL LOW (ref 12.0–15.0)
Hemoglobin: 10 g/dL — ABNORMAL LOW (ref 12.0–15.0)
MCH: 22 pg — ABNORMAL LOW (ref 26.0–34.0)
MCH: 21.7 pg — ABNORMAL LOW (ref 26.0–34.0)
MCHC: 31.8 g/dL (ref 30.0–36.0)
MCHC: 31.5 g/dL (ref 30.0–36.0)
MCV: 69 fL — ABNORMAL LOW (ref 78.0–100.0)
MCV: 68.9 fL — AB (ref 78.0–100.0)
PLATELETS: 231 10*3/uL (ref 150–400)
PLATELETS: 237 10*3/uL (ref 150–400)
RBC: 4.19 MIL/uL (ref 3.87–5.11)
RBC: 4.6 MIL/uL (ref 3.87–5.11)
RDW: 17.7 % — AB (ref 11.5–15.5)
RDW: 17.5 % — AB (ref 11.5–15.5)
WBC: 12 10*3/uL — AB (ref 4.0–10.5)
WBC: 15.2 10*3/uL — ABNORMAL HIGH (ref 4.0–10.5)

## 2016-02-14 LAB — PREGNANCY, URINE: PREG TEST UR: NEGATIVE

## 2016-02-14 LAB — PREPARE RBC (CROSSMATCH)

## 2016-02-14 SURGERY — MYOMECTOMY, ABDOMINAL APPROACH
Anesthesia: General | Site: Abdomen

## 2016-02-14 MED ORDER — PROMETHAZINE HCL 25 MG/ML IJ SOLN
6.2500 mg | INTRAMUSCULAR | Status: DC | PRN
  Administered 2016-02-14: 6.25 mg via INTRAVENOUS

## 2016-02-14 MED ORDER — MENTHOL 3 MG MT LOZG: 1.0000 | LOZENGE | OROMUCOSAL | Status: DC | PRN

## 2016-02-14 MED ORDER — FENTANYL CITRATE (PF) 100 MCG/2ML IJ SOLN
INTRAMUSCULAR | Status: DC | PRN
  Administered 2016-02-14: 100 ug via INTRAVENOUS
  Administered 2016-02-14: 50 ug via INTRAVENOUS
  Administered 2016-02-14: 100 ug via INTRAVENOUS
  Administered 2016-02-14 (×3): 50 ug via INTRAVENOUS
  Administered 2016-02-14: 25 ug via INTRAVENOUS

## 2016-02-14 MED ORDER — LACTATED RINGERS IV SOLN
INTRAVENOUS | Status: DC
  Administered 2016-02-14 (×4): via INTRAVENOUS

## 2016-02-14 MED ORDER — FENTANYL CITRATE (PF) 250 MCG/5ML IJ SOLN
INTRAMUSCULAR | Status: AC
  Filled 2016-02-14: qty 5

## 2016-02-14 MED ORDER — PROMETHAZINE HCL 25 MG/ML IJ SOLN
INTRAMUSCULAR | Status: AC
  Filled 2016-02-14: qty 1

## 2016-02-14 MED ORDER — ONDANSETRON HCL 4 MG/2ML IJ SOLN: 4.0000 mg | Freq: Four times a day (QID) | INTRAMUSCULAR | Status: DC | PRN

## 2016-02-14 MED ORDER — NALOXONE HCL 0.4 MG/ML IJ SOLN: 0.4000 mg | INTRAMUSCULAR | Status: DC | PRN

## 2016-02-14 MED ORDER — ROCURONIUM BROMIDE 100 MG/10ML IV SOLN
INTRAVENOUS | Status: AC
  Filled 2016-02-14: qty 1

## 2016-02-14 MED ORDER — HYDROMORPHONE HCL 1 MG/ML IJ SOLN
INTRAMUSCULAR | Status: AC
  Filled 2016-02-14: qty 1

## 2016-02-14 MED ORDER — LACTATED RINGERS IV SOLN
INTRAVENOUS | Status: DC
  Administered 2016-02-14 – 2016-02-15 (×2): via INTRAVENOUS

## 2016-02-14 MED ORDER — KETOROLAC TROMETHAMINE 30 MG/ML IJ SOLN
INTRAMUSCULAR | Status: AC
  Filled 2016-02-14: qty 1

## 2016-02-14 MED ORDER — ONDANSETRON HCL 4 MG/2ML IJ SOLN
INTRAMUSCULAR | Status: DC | PRN
  Administered 2016-02-14: 4 mg via INTRAVENOUS

## 2016-02-14 MED ORDER — PANTOPRAZOLE SODIUM 40 MG PO TBEC
40.0000 mg | DELAYED_RELEASE_TABLET | Freq: Every day | ORAL | Status: DC
  Administered 2016-02-15 – 2016-02-17 (×3): 40 mg via ORAL
  Filled 2016-02-14 (×3): qty 1

## 2016-02-14 MED ORDER — VASOPRESSIN 20 UNIT/ML IV SOLN
INTRAVENOUS | Status: AC
  Filled 2016-02-14: qty 1

## 2016-02-14 MED ORDER — VASOPRESSIN 20 UNIT/ML IV SOLN
INTRAVENOUS | Status: DC | PRN
  Administered 2016-02-14: 100 mL via INTRAMUSCULAR

## 2016-02-14 MED ORDER — ONDANSETRON HCL 4 MG/2ML IJ SOLN
INTRAMUSCULAR | Status: AC
  Filled 2016-02-14: qty 2

## 2016-02-14 MED ORDER — SODIUM CHLORIDE 0.9% FLUSH: 9.0000 mL | INTRAVENOUS | Status: DC | PRN

## 2016-02-14 MED ORDER — NEOSTIGMINE METHYLSULFATE 10 MG/10ML IV SOLN
INTRAVENOUS | Status: DC | PRN
  Administered 2016-02-14: 3 mg via INTRAVENOUS

## 2016-02-14 MED ORDER — ROCURONIUM BROMIDE 100 MG/10ML IV SOLN
INTRAVENOUS | Status: DC | PRN
  Administered 2016-02-14: 20 mg via INTRAVENOUS
  Administered 2016-02-14: 5 mg via INTRAVENOUS
  Administered 2016-02-14: 10 mg via INTRAVENOUS
  Administered 2016-02-14: 60 mg via INTRAVENOUS

## 2016-02-14 MED ORDER — OXYCODONE-ACETAMINOPHEN 5-325 MG PO TABS
1.0000 | ORAL_TABLET | ORAL | Status: DC | PRN
  Administered 2016-02-15 (×2): 2 via ORAL
  Filled 2016-02-14 (×2): qty 2

## 2016-02-14 MED ORDER — DIPHENHYDRAMINE HCL 50 MG/ML IJ SOLN: 12.5000 mg | Freq: Four times a day (QID) | INTRAMUSCULAR | Status: DC | PRN

## 2016-02-14 MED ORDER — ONDANSETRON HCL 4 MG PO TABS: 4.0000 mg | ORAL_TABLET | Freq: Four times a day (QID) | ORAL | Status: DC | PRN

## 2016-02-14 MED ORDER — SIMETHICONE 80 MG PO CHEW: 80.0000 mg | CHEWABLE_TABLET | Freq: Four times a day (QID) | ORAL | Status: DC | PRN

## 2016-02-14 MED ORDER — MIDAZOLAM HCL 5 MG/5ML IJ SOLN
INTRAMUSCULAR | Status: DC | PRN
  Administered 2016-02-14: 2 mg via INTRAVENOUS

## 2016-02-14 MED ORDER — SCOPOLAMINE 1 MG/3DAYS TD PT72
MEDICATED_PATCH | TRANSDERMAL | Status: AC
  Administered 2016-02-14: 1.5 mg via TRANSDERMAL
  Filled 2016-02-14: qty 1

## 2016-02-14 MED ORDER — MIDAZOLAM HCL 2 MG/2ML IJ SOLN
INTRAMUSCULAR | Status: AC
  Filled 2016-02-14: qty 2

## 2016-02-14 MED ORDER — TRIAMTERENE-HCTZ 37.5-25 MG PO TABS
1.0000 | ORAL_TABLET | Freq: Every day | ORAL | Status: DC
  Administered 2016-02-15 – 2016-02-17 (×3): 1 via ORAL
  Filled 2016-02-14 (×4): qty 1

## 2016-02-14 MED ORDER — BUPIVACAINE HCL (PF) 0.25 % IJ SOLN
INTRAMUSCULAR | Status: AC
  Filled 2016-02-14: qty 30

## 2016-02-14 MED ORDER — LIDOCAINE HCL (CARDIAC) 20 MG/ML IV SOLN
INTRAVENOUS | Status: AC
  Filled 2016-02-14: qty 5

## 2016-02-14 MED ORDER — SODIUM CHLORIDE 0.9 % IJ SOLN
INTRAMUSCULAR | Status: AC
  Filled 2016-02-14: qty 50

## 2016-02-14 MED ORDER — ONDANSETRON HCL 4 MG/2ML IJ SOLN
4.0000 mg | Freq: Four times a day (QID) | INTRAMUSCULAR | Status: DC | PRN
Start: 2016-02-14 — End: 2016-02-17

## 2016-02-14 MED ORDER — PROPOFOL 10 MG/ML IV BOLUS
INTRAVENOUS | Status: DC | PRN
  Administered 2016-02-14: 200 mg via INTRAVENOUS

## 2016-02-14 MED ORDER — GLYCOPYRROLATE 0.2 MG/ML IJ SOLN
INTRAMUSCULAR | Status: DC | PRN
  Administered 2016-02-14: 0.6 mg via INTRAVENOUS

## 2016-02-14 MED ORDER — METHYLENE BLUE 1 % INJ SOLN
INTRAMUSCULAR | Status: AC
  Filled 2016-02-14: qty 1

## 2016-02-14 MED ORDER — SCOPOLAMINE 1 MG/3DAYS TD PT72
1.0000 | MEDICATED_PATCH | Freq: Once | TRANSDERMAL | Status: DC
  Administered 2016-02-14: 1.5 mg via TRANSDERMAL

## 2016-02-14 MED ORDER — KETOROLAC TROMETHAMINE 30 MG/ML IJ SOLN
INTRAMUSCULAR | Status: DC | PRN
Start: 2016-02-14 — End: 2016-02-14
  Administered 2016-02-14: 30 mg via INTRAVENOUS

## 2016-02-14 MED ORDER — HYDROMORPHONE HCL 1 MG/ML IJ SOLN
0.2500 mg | INTRAMUSCULAR | Status: DC | PRN
  Administered 2016-02-14: 0.5 mg via INTRAVENOUS

## 2016-02-14 MED ORDER — SODIUM CHLORIDE 0.9 % IV SOLN: Freq: Once | INTRAVENOUS | Status: DC

## 2016-02-14 MED ORDER — LIDOCAINE HCL (CARDIAC) 20 MG/ML IV SOLN
INTRAVENOUS | Status: DC | PRN
  Administered 2016-02-14: 100 mg via INTRAVENOUS

## 2016-02-14 MED ORDER — BUPIVACAINE HCL (PF) 0.25 % IJ SOLN
INTRAMUSCULAR | Status: DC | PRN
  Administered 2016-02-14: 20 mL

## 2016-02-14 MED ORDER — DEXAMETHASONE SODIUM PHOSPHATE 4 MG/ML IJ SOLN
INTRAMUSCULAR | Status: AC
  Filled 2016-02-14: qty 1

## 2016-02-14 MED ORDER — KETOROLAC TROMETHAMINE 30 MG/ML IJ SOLN
30.0000 mg | Freq: Four times a day (QID) | INTRAMUSCULAR | Status: DC
Start: 2016-02-14 — End: 2016-02-16
  Administered 2016-02-14 – 2016-02-16 (×6): 30 mg via INTRAVENOUS
  Filled 2016-02-14 (×6): qty 1

## 2016-02-14 MED ORDER — DEXAMETHASONE SODIUM PHOSPHATE 4 MG/ML IJ SOLN
INTRAMUSCULAR | Status: DC | PRN
  Administered 2016-02-14: 4 mg via INTRAVENOUS

## 2016-02-14 MED ORDER — IBUPROFEN 600 MG PO TABS: 600.0000 mg | ORAL_TABLET | Freq: Four times a day (QID) | ORAL | Status: DC | PRN

## 2016-02-14 MED ORDER — PROPOFOL 10 MG/ML IV BOLUS
INTRAVENOUS | Status: AC
  Filled 2016-02-14: qty 20

## 2016-02-14 MED ORDER — MORPHINE SULFATE 2 MG/ML IV SOLN
INTRAVENOUS | Status: DC
  Administered 2016-02-14: 20:00:00 via INTRAVENOUS
  Administered 2016-02-14: 9 mg via INTRAVENOUS
  Administered 2016-02-15: 4.5 mg via INTRAVENOUS
  Administered 2016-02-15: 7.5 mg via INTRAVENOUS
  Filled 2016-02-14: qty 25

## 2016-02-14 MED ORDER — DIPHENHYDRAMINE HCL 12.5 MG/5ML PO ELIX
12.5000 mg | ORAL_SOLUTION | Freq: Four times a day (QID) | ORAL | Status: DC | PRN
  Administered 2016-02-16 – 2016-02-17 (×3): 12.5 mg via ORAL
  Filled 2016-02-14 (×4): qty 5

## 2016-02-14 SURGICAL SUPPLY — 50 items
ADAPTER CATH SYR TO TUBING 38M (ADAPTER) IMPLANT
ADPR CATH LL SYR 3/32 TPR (ADAPTER)
BARRIER ADHS 3X4 INTERCEED (GAUZE/BANDAGES/DRESSINGS) ×6 IMPLANT
BRR ADH 4X3 ABS CNTRL BYND (GAUZE/BANDAGES/DRESSINGS) ×2
CANISTER SUCT 3000ML (MISCELLANEOUS) ×3 IMPLANT
CATH FOLEY 2WAY  3CC  8FR (CATHETERS)
CATH FOLEY 2WAY 3CC 8FR (CATHETERS) IMPLANT
CELLS DAT CNTRL 66122 CELL SVR (MISCELLANEOUS) IMPLANT
CHLORAPREP W/TINT 26ML (MISCELLANEOUS) ×3 IMPLANT
CLOTH BEACON ORANGE TIMEOUT ST (SAFETY) ×3 IMPLANT
CONT PATH 16OZ SNAP LID 3702 (MISCELLANEOUS) ×3 IMPLANT
DECANTER SPIKE VIAL GLASS SM (MISCELLANEOUS) ×3 IMPLANT
DRAPE CESAREAN BIRTH W POUCH (DRAPES) ×3 IMPLANT
DRSG OPSITE POSTOP 4X10 (GAUZE/BANDAGES/DRESSINGS) ×2 IMPLANT
ELECT NDL TIP 2.8 STRL (NEEDLE) ×1 IMPLANT
ELECT NEEDLE TIP 2.8 STRL (NEEDLE) ×6 IMPLANT
GAUZE SPONGE 4X4 16PLY XRAY LF (GAUZE/BANDAGES/DRESSINGS) ×3 IMPLANT
GLOVE BIOGEL PI IND STRL 7.0 (GLOVE) ×2 IMPLANT
GLOVE BIOGEL PI INDICATOR 7.0 (GLOVE) ×4
GLOVE ECLIPSE 6.5 STRL STRAW (GLOVE) ×3 IMPLANT
GOWN STRL REUS W/TWL LRG LVL3 (GOWN DISPOSABLE) ×9 IMPLANT
HEMOSTAT SURGICEL 4X8 (HEMOSTASIS) IMPLANT
NEEDLE HYPO 22GX1.5 SAFETY (NEEDLE) ×5 IMPLANT
NS IRRIG 1000ML POUR BTL (IV SOLUTION) ×3 IMPLANT
PACK ABDOMINAL GYN (CUSTOM PROCEDURE TRAY) ×3 IMPLANT
PAD OB MATERNITY 4.3X12.25 (PERSONAL CARE ITEMS) ×3 IMPLANT
RETRACTOR WND ALEXIS 18 MED (MISCELLANEOUS) IMPLANT
RETRACTOR WND ALEXIS 25 LRG (MISCELLANEOUS) IMPLANT
RTRCTR WOUND ALEXIS 18CM MED (MISCELLANEOUS)
RTRCTR WOUND ALEXIS 25CM LRG (MISCELLANEOUS) ×3
SPONGE GAUZE 4X4 12PLY STER LF (GAUZE/BANDAGES/DRESSINGS) ×6 IMPLANT
SPONGE LAP 18X18 X RAY DECT (DISPOSABLE) ×6 IMPLANT
SUT DVC VLOC 180 2-0 12IN GS21 (SUTURE) ×6
SUT MNCRL AB 3-0 PS2 27 (SUTURE) ×5 IMPLANT
SUT PLAIN 2 0 XLH (SUTURE) ×4 IMPLANT
SUT VIC AB 0 CT1 27 (SUTURE) ×33
SUT VIC AB 0 CT1 27XBRD ANBCTR (SUTURE) ×6 IMPLANT
SUT VIC AB 1 CT1 36 (SUTURE) ×3 IMPLANT
SUT VIC AB 2-0 CT1 27 (SUTURE) ×24
SUT VIC AB 2-0 CT1 TAPERPNT 27 (SUTURE) ×4 IMPLANT
SUT VICRYL 2 0 18  TIES (SUTURE) ×2
SUT VICRYL 2 0 18 TIES (SUTURE) IMPLANT
SUT VLOC 180 0 9IN  GS21 (SUTURE) ×2
SUT VLOC 180 0 9IN GS21 (SUTURE) IMPLANT
SUTURE DVC VL 180 2-0 12INGS21 (SUTURE) IMPLANT
SYR 30ML LL (SYRINGE) IMPLANT
SYR CONTROL 10ML LL (SYRINGE) ×5 IMPLANT
TOWEL OR 17X24 6PK STRL BLUE (TOWEL DISPOSABLE) ×6 IMPLANT
TRAY FOLEY CATH SILVER 14FR (SET/KITS/TRAYS/PACK) ×3 IMPLANT
WATER STERILE IRR 1000ML POUR (IV SOLUTION) ×3 IMPLANT

## 2016-02-14 NOTE — Op Note (Signed)
Preoperative diagnosis: Uterine fibroids, s/p previous myomectomy  Postoperative diagnosis: Same  Anesthesia: General  Anesthesiologist: Dr. Tresa Moore  Procedure: Abdominal myomectomy  Surgeon: Dr. Cletis Media  Asst.: Earnstine Regal PA-C  Estimated blood loss: 950 cc  Procedure:  After being informed of the planned procedure with possible complications including but not limited to bleeding, infection, injury to other organs, informed consent is obtained and patient is taken to or #8. She is given general anesthesia with endotracheal intubation without complications.She is placed in the dorsal decubitus position, prepped and draped in a sterile fashion. A Foley catheter is inserted in her bladder.  We infiltrate the supra-pubic area using 20 cc of Marcaine 0.25 and perform a Pfannenstiel incision which is brought down sharply to the fascia. The fascia is incised in a low transverse fashion. Linea alba is dissected and peritoneum is entered bluntly. An Alexis retractor is positioned easily and bowels are retracted with abdominal packings.  Observation: The uterus is enlarged with multiple fibroids as follows: fundal 8 cm, posterior 7,5 and 4 cm, left lateral 4 and 3 cm, anterior 4, 3, 3 and 2 cm and multiple smaller ones. Both tubes and ovaries are normal.  We proceed with systematic excision of all fibroids previously described and some more. A total of 18 fibroids are removed through 3 deep posterior incisions, 1 deep anterior incision and 3 superficial incisions.  All incisions are pre-infiltrated with vasopressine 20 units / 100 cc saline.  All myometrial defects are closed with either figure-of-eight stitches of 0 Vicryl or running sutures of 0 V-Lock. All serosal defects are closed with 2-0 Vicryl baseball sutures.   We irrigated profusely with warm saline and note satisfactory hemostasis. 2 sheet(s) of Interceed are used to wrap the whole uterus.  All sponges and retractors are removed.  Under fascia hemostasis is completed with cauterization.The fascia is closed with 2 running sutures of 1 Vicryl meeting midline. The wound is irrigated with warm saline and hemostasis is completed with cauterization. The subcuticular layer is closed with interrupted 0 Plain sutures.The skin is closed with a subcuticular suture of 3-0 Monocryl and Steri-Strips.  Instruments and sponge count is complete 2.   Estimated blood loss is 950 cc.  The procedure is well tolerated by the patient is taken to recovery room in a well and stable condition. Pre-operative hemoglobin was 10.4. Peri-operative hemoglobin was 9.2  Specimen: 18 fibroids weighing 442 g sent to pathology

## 2016-02-14 NOTE — Anesthesia Procedure Notes (Signed)
Procedure Name: Intubation Date/Time: 02/14/2016 1:39 PM Performed by: Talbot Grumbling Pre-anesthesia Checklist: Patient identified, Emergency Drugs available, Suction available and Patient being monitored Patient Re-evaluated:Patient Re-evaluated prior to inductionOxygen Delivery Method: Circle system utilized Preoxygenation: Pre-oxygenation with 100% oxygen Intubation Type: IV induction Ventilation: Mask ventilation without difficulty Laryngoscope Size: Mac and 3 (attempt with miller 2 without success.) Grade View: Grade II Tube type: Oral Tube size: 7.0 mm Number of attempts: 2 Airway Equipment and Method: Stylet Placement Confirmation: ETT inserted through vocal cords under direct vision,  positive ETCO2 and breath sounds checked- equal and bilateral Secured at: 22 cm Tube secured with: Tape Dental Injury: Teeth and Oropharynx as per pre-operative assessment

## 2016-02-14 NOTE — Interval H&P Note (Signed)
History and Physical Interval Note:  02/14/2016 1:05 PM  Amy Morrison  has presented today for surgery, with the diagnosis of Uterine Fibroids   The various methods of treatment have been discussed with the patient and family. After consideration of risks, benefits and other options for treatment, the patient has consented to  Procedure(s): MYOMECTOMY (N/A) as a surgical intervention .  The patient's history has been reviewed, patient examined, no change in status, stable for surgery.  I have reviewed the patient's chart and labs.  Questions were answered to the patient's satisfaction.     [name] A

## 2016-02-14 NOTE — Transfer of Care (Signed)
Immediate Anesthesia Transfer of Care Note  Patient: Amy Morrison  Procedure(s) Performed: Procedure(s): MYOMECTOMY (N/A)  Patient Location: PACU  Anesthesia Type:General  Level of Consciousness: awake  Airway & Oxygen Therapy: Patient Spontanous Breathing  Post-op Assessment: Report given to PACU RN  Post vital signs: stable  Filed Vitals:   02/14/16 1116  BP: 152/97  Pulse: 85  Temp: 36.6 C  Resp: 18    Complications: No apparent anesthesia complications

## 2016-02-14 NOTE — H&P (View-Only) (Signed)
Amy Morrison is a 45 y.o.  female G:0 who desires pregnancy and  presents for myomectomy because of a large fibroid containing uterus.  The patient underwent myomectomy 9 yeas ago with the removal of 10 fibroids.  She has been married for four years and though she has not used contraception,  she has not conceived.  Her menstrual flow is monthly with pad change 6 times during the day and twice at night.  Fortunately she doesn't complain of any significant cramping.  She goes on to deny inter-menstrual bleeding, post-coital bleeding,  dyspareunia or changes in bowel or bladder function.  A pelvic ultrasound in January 2016 showed a uterus: 18.7 x 6.65 x 9.61 cm (retroflexed) with #3 fibroids: fundal, right sub-serosal: 6.75 x 6.79 x 8.13 cm; left intramural: 6.66 x 5.38 x 6.45 cm and posterior sub-serosal: 4.67 x 4.83 x 5.64 cm;  there was limited visualization of the myometrium and endometrium due to the fibroids;  right ovary-normal appearing and left ovary was  not visualized.  In January 2016 endometrial curettings returned endometrial polyp with fragments of endometrial polyp, no atypia ro malignancy; fragments of late proliferative early secretory phase endometrium with fractures of breakdown; no malignancy or hyperplasia.  In light of the patient's desire for pregnancy,  the size of her fibroids and heavy menstrual flow,  she has decided to proceed with an abdominal myomectomy.   Past Medical History  OB History: G:0  GYN History: menarche: 45 YO    LMP: 02/02/16    Contracepton: none, desires pregnancy;     Denies history of abnormal PAP smear.   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   ROS: Admits to glasses;  Denies headache, vision changes, nasal congestion, dysphagia, tinnitus, dizziness, hoarseness, cough,  chest pain, shortness of breath, nausea, vomiting, diarrhea,constipation,  urinary frequency, urgency  dysuria, hematuria, vaginitis symptoms, pelvic pain, swelling of joints,easy bruising,  myalgias, arthralgias, skin rashes, unexplained weight loss and except as is mentioned in the history of present illness, patient's review of systems is otherwise negative.   Physical Exam  Bp: 120/86   P: 76   R: 20   Temperature:  98.7 degrees F orally  Weight: 311 lbs.  Height: 5'8"  BMI: 47.3  Neck: supple without masses or thyromegaly Lungs: clear to auscultation Heart: regular rate and rhythm Abdomen: firm mass from pelvis to 3 fingers below umbilicus and is tender  Pelvic:EGBUS- wnl; vagina-normal rugae; uterus-tender and 20-22 weeks size, irregularly shaped, cervix without lesions or motion tenderness; adnexae-no tenderness or masses Extremities:  no clubbing, cyanosis or edema   Assesment: Uterine Fibroids           Menorrhagia           Infertility   Disposition:  Reviewed the risks  of surgery to include, but not limited to: reaction to anesthesia, damage to adjacent organs, infection, excessive bleeding and if the endometrial cavity is breached a C-section delivery will be required. The patient verbalized understanding of these risks and has consented to proceed with Abdominal  Myomectomy at [facility] on February 14, 2016 1 p.m.   CSN# [id number]   Daralyn Bert J. Florene Glen, PA-C  for Dr. Dede Query. Rivard

## 2016-02-14 NOTE — OR Nursing (Signed)
Family updated by phone per Dr. Cletis Media.

## 2016-02-14 NOTE — Anesthesia Preprocedure Evaluation (Addendum)
Anesthesia Evaluation  Patient identified by MRN, date of birth, ID band Patient awake    Reviewed: Allergy & Precautions, H&P , Patient's Chart, lab work & pertinent test results, reviewed documented beta blocker date and time   History of Anesthesia Complications Negative for: history of anesthetic complications  Airway Mallampati: III  TM Distance: >3 FB Neck ROM: full    Dental no notable dental hx. (+) Dental Advisory Given   Pulmonary neg pulmonary ROS,    Pulmonary exam normal breath sounds clear to auscultation       Cardiovascular Exercise Tolerance: Good hypertension, Pt. on medications Normal cardiovascular exam     Neuro/Psych negative neurological ROS  negative psych ROS   GI/Hepatic Neg liver ROS, hiatal hernia, GERD  Controlled,  Endo/Other  Morbid obesity  Renal/GU negative Renal ROS     Musculoskeletal   Abdominal   Peds  Hematology negative hematology ROS (+)   Anesthesia Other Findings   Reproductive/Obstetrics                            Anesthesia Physical  Anesthesia Plan  ASA: III  Anesthesia Plan: General   Post-op Pain Management:    Induction: Intravenous  Airway Management Planned: Oral ETT  Additional Equipment:   Intra-op Plan:   Post-operative Plan: Extubation in OR  Informed Consent: I have reviewed the patients History and Physical, chart, labs and discussed the procedure including the risks, benefits and alternatives for the proposed anesthesia with the patient or authorized representative who has indicated his/her understanding and acceptance.   Dental advisory given  Plan Discussed with: Anesthesiologist  Anesthesia Plan Comments:        Anesthesia Quick Evaluation

## 2016-02-14 NOTE — Anesthesia Postprocedure Evaluation (Signed)
Anesthesia Post Note  Patient: Amy Morrison  Procedure(s) Performed: Procedure(s) (LRB): MYOMECTOMY (N/A)  Patient location during evaluation: PACU Anesthesia Type: General Level of consciousness: awake and alert Pain management: pain level controlled Vital Signs Assessment: post-procedure vital signs reviewed and stable Respiratory status: spontaneous breathing, nonlabored ventilation, respiratory function stable and patient connected to nasal cannula oxygen Cardiovascular status: blood pressure returned to baseline and stable Postop Assessment: no signs of nausea or vomiting Anesthetic complications: no    Last Vitals:  Filed Vitals:   02/14/16 1116 02/14/16 1719  BP: 152/97   Pulse: 85   Temp: 36.6 C 37.2 C  Resp: 18     Last Pain: There were no vitals filed for this visit.               Alexis Frock

## 2016-02-15 ENCOUNTER — Encounter (HOSPITAL_COMMUNITY): Payer: Self-pay | Admitting: Obstetrics and Gynecology

## 2016-02-15 MED ORDER — HYDROMORPHONE HCL 2 MG PO TABS
4.0000 mg | ORAL_TABLET | ORAL | Status: DC | PRN
  Administered 2016-02-15 – 2016-02-17 (×10): 4 mg via ORAL
  Filled 2016-02-15 (×10): qty 2

## 2016-02-15 NOTE — Addendum Note (Signed)
Addendum  created 02/15/16 0804 by Talbot Grumbling, CRNA   Modules edited: Clinical Notes   Clinical Notes:  File: [id number]; File: [id number]

## 2016-02-15 NOTE — Anesthesia Postprocedure Evaluation (Addendum)
Anesthesia Post Note  Patient: Amy Morrison  Procedure(s) Performed: Procedure(s) (LRB): MYOMECTOMY (N/A)  Patient location during evaluation: Mother Baby Anesthesia Type: General Level of consciousness: awake and alert and oriented Pain management: pain level controlled (patient states pain level being controlled with pain medication) Vital Signs Assessment: post-procedure vital signs reviewed and stable Respiratory status: spontaneous breathing Cardiovascular status: blood pressure returned to baseline Postop Assessment: no signs of nausea or vomiting and adequate PO intake Anesthetic complications: no    Last Vitals:  Filed Vitals:   02/15/16 0526 02/15/16 0530  BP: 110/61   Pulse: 86   Temp: 36.9 C   Resp: 16 16    Last Pain:  Filed Vitals:   02/15/16 0740  PainSc: 7                  Marcelle Hepner

## 2016-02-15 NOTE — Progress Notes (Signed)
Patient reports pain relief with the po dilaudid and toradol iv inj.  Notified MD.

## 2016-02-15 NOTE — Progress Notes (Signed)
Amy Morrison is a25 y.o.  [id number]  Post Op Date # Abdominal Myomectomy  Subjective: Patient is Doing well postoperatively. However,  the patient reports pain  (PCA has been stopped for 3 hours) and hasn't had any analgesia since approximately 4 a.m.   She is ambulating without difficulty,  has voided 300 cc and tolerating liquids (preparing to eat a regular breakfast).  Objective: Vital signs in last 24 hours: Temp:  [97.9 F (36.6 C)-98.9 F (37.2 C)] 98.4 F (36.9 C) (03/17 0526) Pulse Rate:  [82-97] 86 (03/17 0526) Resp:  [12-19] 16 (03/17 0530) BP: (97-152)/(53-97) 110/61 mmHg (03/17 0526) SpO2:  [96 %-100 %] 96 % (03/17 0530) Weight:  [306 lb (138.801 kg)] 306 lb (138.801 kg) (03/17 0300)  Intake/Output from previous day: 03/16 0701 - 03/17 0700 In: 4940.4 [P.O.:380; I.V.:4560.4] Out: 3050 [Urine:2100] Intake/Output this shift: Total I/O In: -  Out: 300 [Urine:300]  Recent Labs Lab 02/08/16 1350 02/14/16 1624 02/14/16 2013  WBC 6.0 12.0* 15.2*  HGB 10.4* 9.2* 10.0*  HCT 32.8* 28.9* 31.7*  PLT 268 231 237     Recent Labs Lab 02/08/16 1350  NA 141  K 3.7  CL 108  CO2 25  BUN 11  CREATININE 0.77  CALCIUM 8.7*  GLUCOSE 85    EXAM: General: alert, cooperative and no distress Resp: clear to auscultation bilaterally Cardio: regular rate and rhythm, S1, S2 normal, no murmur, click, rub or gallop GI: Bowel sounds present and abdominal dressing is clean/dry/intact Extremities: Homans sign is negative, no sign of DVT and no calf tenderness.   Assessment: s/p Procedure(s): MYOMECTOMY: stable, progressing well and anemia  Plan: Advance diet Encourage ambulation Advance to PO medication Routine care  LOS: 1 day    POWELL,ELMIRA, PA-C 02/15/2016 7:38 AM  Seen and agreed Ambulating, voiding and tolerating diet but struggling with pain management: remains 5/10 despite IV Toradol and Percocet. Op findings reviewed. Will change to PO  Dilaudid and if still inadequate, will resume PCA until am. Patient agreeable. Plan reviewed with RN to update me on pain assessment.

## 2016-02-15 NOTE — Progress Notes (Signed)
UR chart review completed.  

## 2016-02-16 MED ORDER — HYDROMORPHONE HCL 4 MG PO TABS: 4.0000 mg | ORAL_TABLET | ORAL | Status: DC | PRN

## 2016-02-16 MED ORDER — IBUPROFEN 600 MG PO TABS
600.0000 mg | ORAL_TABLET | Freq: Four times a day (QID) | ORAL | Status: DC | PRN
Start: 2016-02-16 — End: 2016-08-16

## 2016-02-16 MED ORDER — KETOROLAC TROMETHAMINE 10 MG PO TABS
10.0000 mg | ORAL_TABLET | Freq: Four times a day (QID) | ORAL | Status: DC
  Administered 2016-02-16 – 2016-02-17 (×5): 10 mg via ORAL
  Filled 2016-02-16 (×9): qty 1

## 2016-02-16 NOTE — Progress Notes (Signed)
2 Days Post-Op Procedure(s) (LRB): MYOMECTOMY (N/A)  Subjective: Patient reports that pain is well managed.  Tolerating normal diet  diet without difficulty. No nausea / vomiting.  Ambulating and voiding.  Objective: BP 117/60 mmHg  Pulse 86  Temp(Src) 98.7 F (37.1 C) (Oral)  Resp 16  Ht 5\' 8"  (1.727 m)  Wt 306 lb (138.801 kg)  BMI 46.54 kg/m2  SpO2 96%  LMP 02/09/2016 Lungs: clear Heart: normal rate and rhythm Abdomen:soft and appropriately tender Extremities: Homans sign is negative, no sign of DVT Incision: covered  Assessment: s/p Procedure(s): MYOMECTOMY: progressing well  Plan: Discharge home tomorrow per patient request Discharge instructions reviewed  LOS: 2 days    Amy Morrison A 02/16/2016, 12:51 PM

## 2016-02-16 NOTE — Discharge Summary (Signed)
  Physician Discharge Summary  Patient ID: Amy Morrison MRN: [id number] DOB/AGE: 1971/01/04 45 y.o.  Admit date: 02/14/2016 Discharge date: 02/16/2016  Admission Diagnoses: Uterine Fibroids   Discharge Diagnoses: Uterine Fibroids         Active Problems:   Fibroids   Discharged Condition: good  Hospital Course: uncomplicated  Treatments: surgery: abdominal myomectomy on 02/14/16. 18 fibroids removed weighing 442 g  Disposition: 01-Home or Self Care     Medication List    TAKE these medications        HYDROmorphone 4 MG tablet  Commonly known as:  DILAUDID  Take 1 tablet (4 mg total) by mouth every 4 (four) hours as needed for severe pain.     ibuprofen 600 MG tablet  Commonly known as:  ADVIL,MOTRIN  Take 1 tablet (600 mg total) by mouth every 6 (six) hours as needed (mild pain).     nystatin 100000 UNIT/GM Powd  Apply 1 g topically 3 (three) times daily.     pantoprazole 40 MG tablet  Commonly known as:  PROTONIX  Take 40 mg by mouth daily.     triamterene-hydrochlorothiazide 37.5-25 MG tablet  Commonly known as:  MAXZIDE-25  Take 1 tablet by mouth daily.           Follow-up Information    Follow up with [facility] A, MD On 03/25/2016.   Specialty:  Obstetrics and Gynecology   Why:  appointment time 10:45 a.m.   Contact information:   [address] [place] [address] [phone]       Signed: Alwyn Pea, MD 02/16/2016, 12:48 PM

## 2016-02-16 NOTE — Discharge Instructions (Signed)
Call [facility] @ [phone] if:  You have a temperature greater than or equal to 100.4 degrees Farenheit orally You have pain that is not made better by the pain medication given and taken as directed You have excessive bleeding or problems urinating  Take Colace (Docusate Sodium/Stool Softener) 100 mg 2-3 times daily while taking narcotic pain medicine to avoid constipation or until bowel movements are regular. Take Ibuprofen 600 mg with food every 6 hours for 5 days then as needed for pain Take an over the counter iron supplement of your choice twice a day  You may drive after 2 weeks You may walk up steps  You may shower  You may resume a regular diet  Keep incisions clean and dry but remove your honeycomb dressing on February 21, 2016 Do not lift over 15 pounds for 6 weeks Avoid anything in vagina for 6 weeks (or until after your post-operative visit

## 2016-02-17 MED ORDER — KETOROLAC TROMETHAMINE 10 MG PO TABS: 10.0000 mg | ORAL_TABLET | Freq: Four times a day (QID) | ORAL | Status: DC

## 2016-02-23 LAB — TYPE AND SCREEN
ABO/RH(D): A POS
ANTIBODY SCREEN: NEGATIVE
Unit division: 0
Unit division: 0

## 2016-08-16 ENCOUNTER — Emergency Department (HOSPITAL_COMMUNITY)
Admission: EM | Admit: 2016-08-16 | Discharge: 2016-08-16 | Disposition: A | Discharge status: Home / Self Care | Payer: BLUE CROSS/BLUE SHIELD | Attending: Emergency Medicine | Admitting: Emergency Medicine

## 2016-08-16 ENCOUNTER — Encounter (HOSPITAL_COMMUNITY): Payer: Self-pay | Admitting: Emergency Medicine

## 2016-08-16 ENCOUNTER — Emergency Department (HOSPITAL_COMMUNITY): Payer: BLUE CROSS/BLUE SHIELD

## 2016-08-16 DIAGNOSIS — R0600 Dyspnea, unspecified: Secondary | ICD-10-CM

## 2016-08-16 DIAGNOSIS — R0789 Other chest pain: Secondary | ICD-10-CM | POA: Diagnosis present

## 2016-08-16 DIAGNOSIS — I10 Essential (primary) hypertension: Secondary | ICD-10-CM | POA: Insufficient documentation

## 2016-08-16 DIAGNOSIS — R079 Chest pain, unspecified: Secondary | ICD-10-CM

## 2016-08-16 LAB — CBC
HCT: 36.2 % (ref 36.0–46.0)
Hemoglobin: 10.7 g/dL — ABNORMAL LOW (ref 12.0–15.0)
MCH: 19.4 pg — AB (ref 26.0–34.0)
MCHC: 29.6 g/dL — ABNORMAL LOW (ref 30.0–36.0)
MCV: 65.7 fL — AB (ref 78.0–100.0)
PLATELETS: 246 10*3/uL (ref 150–400)
RBC: 5.51 MIL/uL — ABNORMAL HIGH (ref 3.87–5.11)
RDW: 19.2 % — AB (ref 11.5–15.5)
WBC: 7.8 10*3/uL (ref 4.0–10.5)

## 2016-08-16 LAB — BASIC METABOLIC PANEL
Anion gap: 10 (ref 5–15)
BUN: 18 mg/dL (ref 6–20)
CHLORIDE: 104 mmol/L (ref 101–111)
CO2: 25 mmol/L (ref 22–32)
CREATININE: 1.08 mg/dL — AB (ref 0.44–1.00)
Calcium: 9.8 mg/dL (ref 8.9–10.3)
GFR calc Af Amer: >60 mL/min (ref >60)
GFR calc non Af Amer: >60 mL/min (ref >60)
Glucose, Bld: 152 mg/dL — ABNORMAL HIGH (ref 65–99)
Potassium: 3.8 mmol/L (ref 3.5–5.1)
SODIUM: 139 mmol/L (ref 135–145)

## 2016-08-16 LAB — I-STAT TROPONIN, ED
Troponin i, poc: 0 ng/mL (ref 0.00–0.08)
Troponin i, poc: 0 ng/mL (ref 0.00–0.08)

## 2016-08-16 LAB — D-DIMER, QUANTITATIVE (NOT AT ARMC): D DIMER QUANT: 0.73 ug{FEU}/mL — AB (ref 0.00–0.50)

## 2016-08-16 MED ORDER — IOPAMIDOL (ISOVUE-370) INJECTION 76%
INTRAVENOUS | Status: AC
  Administered 2016-08-16: 100 mL
  Filled 2016-08-16: qty 100

## 2016-08-16 MED ORDER — SODIUM CHLORIDE 0.9 % IV BOLUS (SEPSIS)
1000.0000 mL | Freq: Once | INTRAVENOUS | Status: AC
  Administered 2016-08-16: 1000 mL via INTRAVENOUS

## 2016-08-16 MED ORDER — ACETAMINOPHEN 500 MG PO TABS
1000.0000 mg | ORAL_TABLET | Freq: Once | ORAL | Status: AC
Start: 2016-08-16 — End: 2016-08-16
  Administered 2016-08-16: 1000 mg via ORAL
  Filled 2016-08-16: qty 2

## 2016-08-16 NOTE — ED Provider Notes (Signed)
[facility] DEPT Provider Note   CSN: [id number] Arrival date & time: 08/16/16  0114     History   Chief Complaint Chief Complaint  Patient presents with  . Arm Pain  . Shortness of Breath    HPI Amy Morrison is a 45 y.o. female presenting with acute dyspnea, chest pain, and left arm pain. She was sleeping in her mother's hospital room tonight. Around 10 AM she woke up and she didn't feel right. She felt fatigued, dizzy, and some palpitations. When she gets up to walk to go leave the room she felt dyspnea as well as left-sided chest, neck, and shoulder pain. Finally she sat down and was wheeled down to the ER. She has been in the waiting room throughout the night. Symptoms have significantly improved now that she is resting. Chest pain feels like a cramping and pressure sensation. Pain is now minimal in her chest. There is some pain in her left upper arm but the dyspnea is gone. Has not been having any cough, unilateral leg swelling, travel, or prior DVT/PE history.  HPI  Past Medical History:  Diagnosis Date  . GERD (gastroesophageal reflux disease)    occasional - diet and otc med prn  . History of hiatal hernia    seen when she had a colonoscopy   . Hypertension     Patient Active Problem List   Diagnosis Date Noted  . Fibroids 02/14/2016    Past Surgical History:  Procedure Laterality Date  . BUNIONECTOMY    . DILATATION & CURETTAGE/HYSTEROSCOPY WITH MYOSURE N/A 12/22/2014   Procedure: DILATATION & CURETTAGE/HYSTEROSCOPY WITH MYOSURE;  Surgeon: Delsa Bern, MD;  Location: [facility];  Service: Gynecology;  Laterality: N/A;  POSSIBLE VERSAPOINT  . DILATION AND CURETTAGE OF UTERUS    . LAPAROTOMY  2007   fibroids  . MYOMECTOMY N/A 02/14/2016   Procedure: MYOMECTOMY;  Surgeon: Delsa Bern, MD;  Location: [facility];  Service: Gynecology;  Laterality: N/A;  . WISDOM TOOTH EXTRACTION      OB History    No data available       Home Medications    Prior to  Admission medications   Medication Sig Start Date End Date Taking? Authorizing Provider  acetaminophen (TYLENOL) 500 MG tablet Take 1,000 mg by mouth every 6 (six) hours as needed for mild pain.   Yes Historical Provider, MD  nystatin (MYCOSTATIN/NYSTOP) 100000 UNIT/GM POWD Apply 1 g topically 3 (three) times daily.   Yes Historical Provider, MD  pantoprazole (PROTONIX) 40 MG tablet Take 40 mg by mouth daily.   Yes Historical Provider, MD  triamterene-hydrochlorothiazide (MAXZIDE-25) 37.5-25 MG per tablet Take 1 tablet by mouth daily.   Yes Historical Provider, MD    Family History No family history on file.  Social History Social History  Substance Use Topics  . Smoking status: Never Smoker  . Smokeless tobacco: Never Used  . Alcohol use No     Allergies   Ibuprofen   Review of Systems Review of Systems  Respiratory: Positive for shortness of breath.   Cardiovascular: Positive for chest pain. Negative for leg swelling.  Gastrointestinal: Negative for abdominal pain.  Musculoskeletal: Positive for myalgias. Negative for back pain.  All other systems reviewed and are negative.    Physical Exam Updated Vital Signs BP 123/81   Pulse 73   Temp 99.4 F (37.4 C) (Oral)   Resp 18   Ht 5\' 8"  (1.727 m)   Wt (!) 304 lb (137.9 kg)  LMP 08/03/2016 (Within Days)   SpO2 100%   BMI 46.22 kg/m   Physical Exam  Constitutional: She is oriented to person, place, and time. She appears well-developed and well-nourished. No distress.  Morbidly obese. Resting in no acute distress  HENT:  Head: Normocephalic and atraumatic.  Right Ear: External ear normal.  Left Ear: External ear normal.  Nose: Nose normal.  Eyes: Right eye exhibits no discharge. Left eye exhibits no discharge.  Cardiovascular: Normal rate, regular rhythm and normal heart sounds.   Pulses:      Radial pulses are 2+ on the right side, and 2+ on the left side.  Pulmonary/Chest: Effort normal and breath sounds  normal. She exhibits tenderness.    Abdominal: Soft. There is no tenderness.  Neurological: She is alert and oriented to person, place, and time.  Skin: Skin is warm and dry. She is not diaphoretic.  Nursing note and vitals reviewed.    ED Treatments / Results  Labs (all labs ordered are listed, but only abnormal results are displayed) Labs Reviewed  BASIC METABOLIC PANEL - Abnormal; Notable for the following:       Result Value   Glucose, Bld 152 (*)    Creatinine, Ser 1.08 (*)    All other components within normal limits  CBC - Abnormal; Notable for the following:    RBC 5.51 (*)    Hemoglobin 10.7 (*)    MCV 65.7 (*)    MCH 19.4 (*)    MCHC 29.6 (*)    RDW 19.2 (*)    All other components within normal limits  D-DIMER, QUANTITATIVE (NOT AT [facility]) - Abnormal; Notable for the following:    D-Dimer, Quant 0.73 (*)    All other components within normal limits  I-STAT TROPOININ, ED  I-STAT TROPOININ, ED    EKG  EKG Interpretation  Date/Time:  Saturday August 16 2016 01:32:52 EDT Ventricular Rate:  106 PR Interval:  156 QRS Duration: 84 QT Interval:  342 QTC Calculation: 454 R Axis:   28 Text Interpretation:  Sinus tachycardia Otherwise normal ECG besides tachycardia, no significant change since May 2017 Confirmed by Regenia Skeeter MD, Aliayah Tyer [phone]) on 08/16/2016 7:01:57 AM       EKG Interpretation  Date/Time:  Saturday August 16 2016 08:36:09 EDT Ventricular Rate:  77 PR Interval:  156 QRS Duration: 88 QT Interval:  398 QTC Calculation: 451 R Axis:   69 Text Interpretation:  Sinus rhythm no acute ST/T changes tachycardia resolved Confirmed by Regenia Skeeter MD, Tuana Hoheisel [phone]) on 08/16/2016 8:56:11 AM       Radiology Dg Chest 2 View  Result Date: 08/16/2016 CLINICAL DATA:  Acute onset of left-sided chest pain. Initial encounter. EXAM: CHEST  2 VIEW COMPARISON:  None. FINDINGS: The lungs are well-aerated and clear. There is no evidence of focal opacification, pleural  effusion or pneumothorax. The heart is normal in size; the mediastinal contour is within normal limits. No acute osseous abnormalities are seen. IMPRESSION: No acute cardiopulmonary process seen. Electronically Signed   By: Garald Balding M.D.   On: 08/16/2016 02:28   Ct Angio Chest Pe W/cm &/or Wo Cm  Result Date: 08/16/2016 CLINICAL DATA:  45 year old female with left chest pain, shortness of breath and elevated D-dimer. EXAM: CT ANGIOGRAPHY CHEST WITH CONTRAST TECHNIQUE: Multidetector CT imaging of the chest was performed using the standard protocol during bolus administration of intravenous contrast. Multiplanar CT image reconstructions and MIPs were obtained to evaluate the vascular anatomy. CONTRAST:  100 cc intravenous  Isovue 370 COMPARISON:  08/16/2016 chest radiograph FINDINGS: Cardiovascular: This is a technically adequate study. No pulmonary emboli are identified. The heart and great vessels are unremarkable. There is no evidence of thoracic aortic aneurysm. Mediastinum/Nodes: No mediastinal mass, enlarged lymph nodes or pericardial effusion. A small to moderate hiatal hernia is present. Lungs/Pleura: Mild dependent and bibasilar atelectasis identified. Mild ground-glass opacities in a mosaic type pattern suggests small airway or small vessel disease. No airspace disease, consolidation, nodule, mass, endobronchial/ endotracheal lesion, pleural effusion or pneumothorax identified. Upper Abdomen: No significant abnormalities identified. Musculoskeletal: No acute or suspicious abnormalities. Review of the MIP images confirms the above findings. IMPRESSION: No evidence of pulmonary emboli or thoracic aortic aneurysm. Mild nonspecific ground-glass opacities in a mosaic type pattern suggesting small airway or small vessel disease. Mild dependent and bibasilar atelectasis. Electronically Signed   By: Margarette Canada M.D.   On: 08/16/2016 11:14    Procedures Procedures (including critical care  time)  Medications Ordered in ED Medications  acetaminophen (TYLENOL) tablet 1,000 mg (1,000 mg Oral Given 08/16/16 0815)  sodium chloride 0.9 % bolus 1,000 mL (0 mLs Intravenous Stopped 08/16/16 1135)  iopamidol (ISOVUE-370) 76 % injection (100 mLs  Contrast Given 08/16/16 0947)     Initial Impression / Assessment and Plan / ED Course  I have reviewed the triage vital signs and the nursing notes.  Pertinent labs & imaging results that were available during my care of the patient were reviewed by me and considered in my medical decision making (see chart for details).  Clinical Course  Comment By Time  Will get 2nd troponin and add ddimer given ECG shows tachycardia but no other risk factors in otherwise low risk PE. If 2nd trop negative, f/u with PCP for outpatient workup. Tylenol for pain (states she can't take NSAIDs due to reflux) Sherwood Gambler, MD 09/16 [phone]  Ddimer mildly positive. CTA. 2nd trop negative Sherwood Gambler, MD 09/16 [phone]  Patient continues to feel better. CT shows no obvious pathology causing her symptoms. Possible airway disease, patient informed of this, follow-up with her PCP. Advised of return precautions. Probably chest wall in etiology. Sherwood Gambler, MD 09/16 1137    Final Clinical Impressions(s) / ED Diagnoses   Final diagnoses:  Chest pain, unspecified chest pain type  Dyspnea    New Prescriptions New Prescriptions   No medications on file     Sherwood Gambler, MD 08/16/16 1138

## 2016-08-16 NOTE — ED Notes (Signed)
Declined W/C at D/C and was escorted to lobby by RN. 

## 2016-08-16 NOTE — ED Triage Notes (Signed)
Pt arrives with sudden onset of shortness of breath and L arm/shoulder pain onset while leaving mother's hospital room. States felt "real hot" all of a sudden. Hx L shoulder and neck pain, and she states that this feels similar after initial shortness of breath subsided.

## 2017-12-06 IMAGING — CT CT ANGIO CHEST
1 of 9 series · 17 of 36 positions shown · IV contrast (isovue)
Comparison: 08/16/2016 chest radiograph

CLINICAL DATA: 44-year-old female with left chest pain, shortness
of breath and elevated D-dimer.

EXAM:
CT ANGIOGRAPHY CHEST WITH CONTRAST
TECHNIQUE: Multidetector CT imaging of the chest was performed using the
standard protocol during bolus administration of intravenous
contrast. Multiplanar CT image reconstructions and MIPs were
obtained to evaluate the vascular anatomy.
CONTRAST:  100 cc intravenous Isovue 370

[Series 406: thins pacs · axial · 0.71mm/px · z∈[+768,+1057]mm · 17 of 327 slices shown]
[im 19/327  lung]
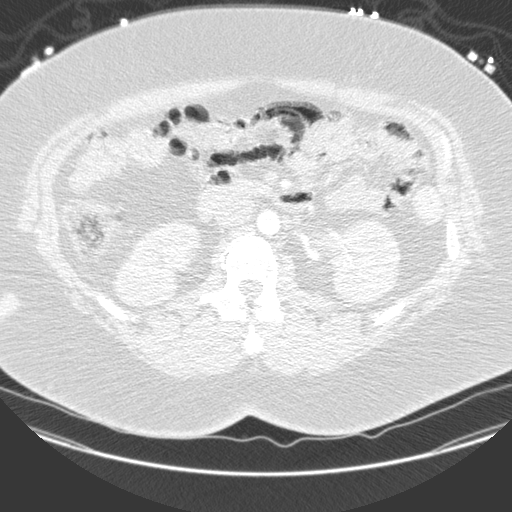
[im 37/327  mediastinal]
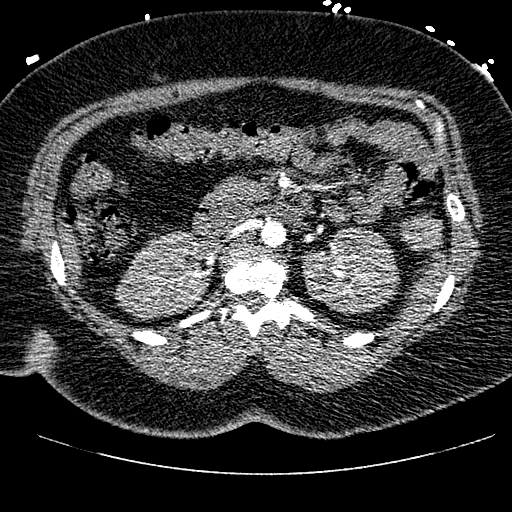
[im 55/327  lung]
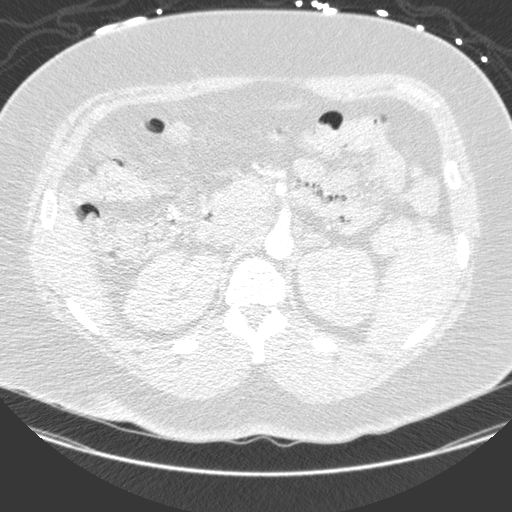
[im 73/327  mediastinal]
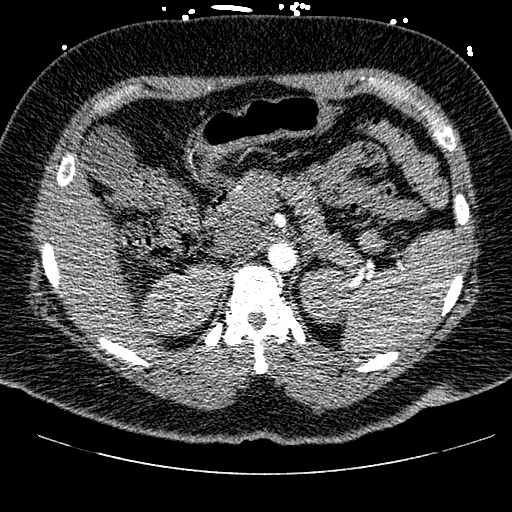
[im 91/327  lung]
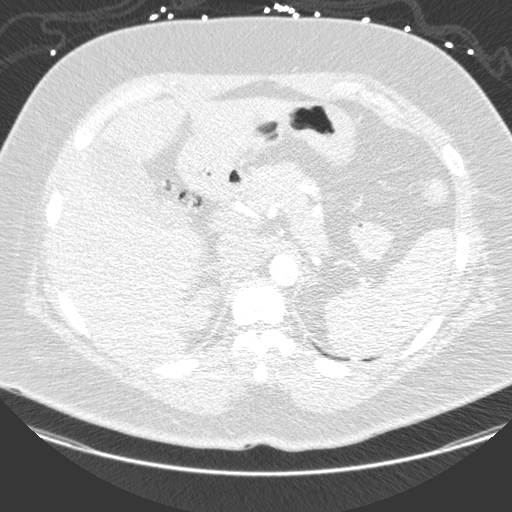
[im 109/327  mediastinal]
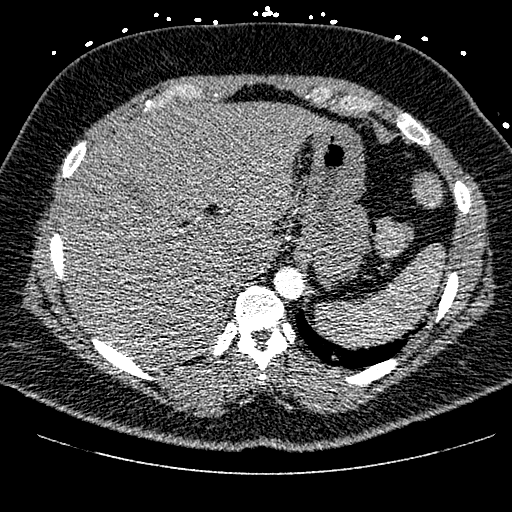
[im 127/327  lung]
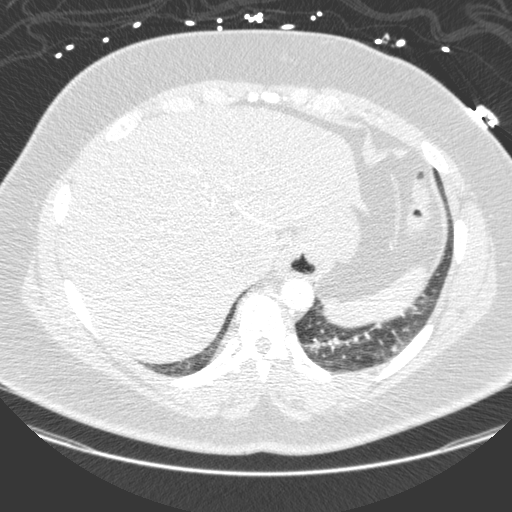
[im 145/327  mediastinal]
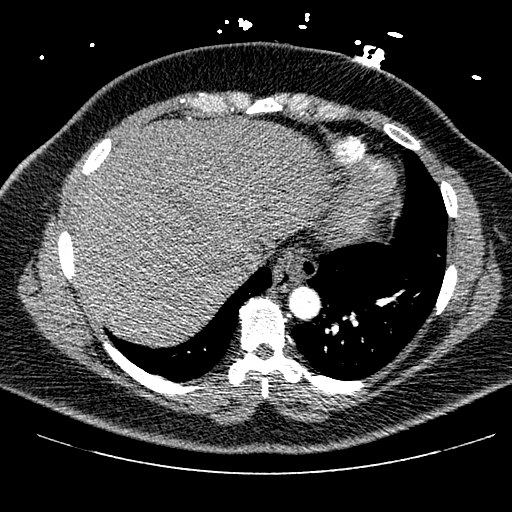
[im 164/327  lung]
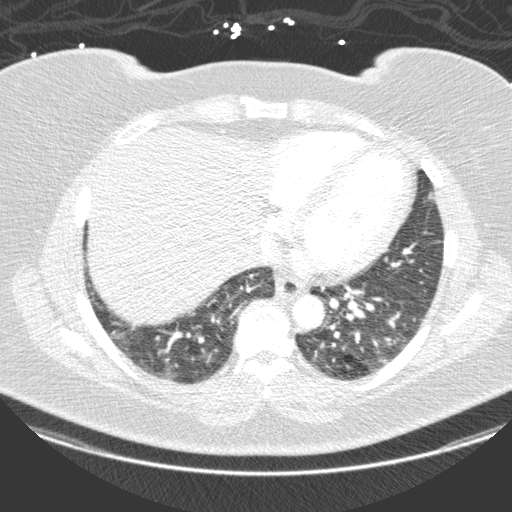
[im 182/327  mediastinal]
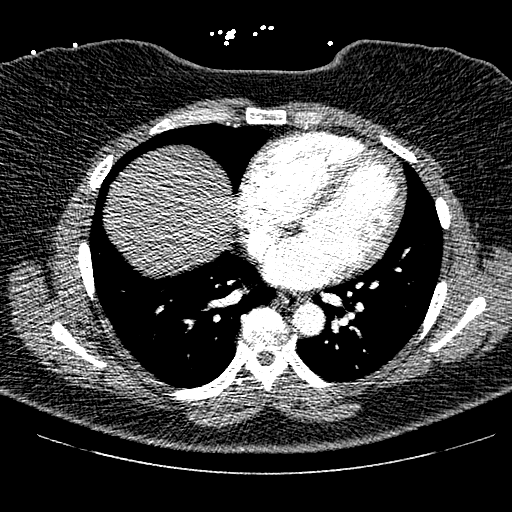
[im 200/327  lung]
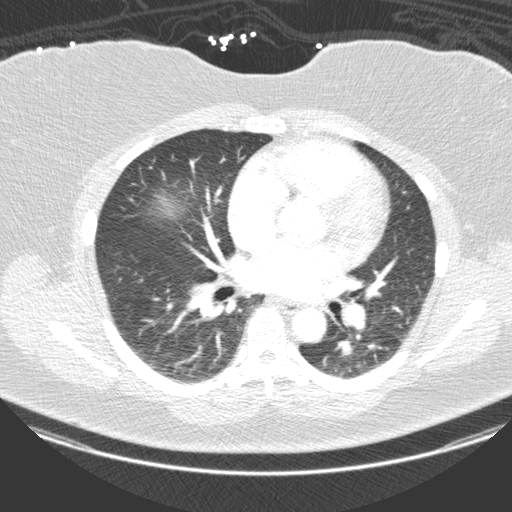
[im 218/327  mediastinal]
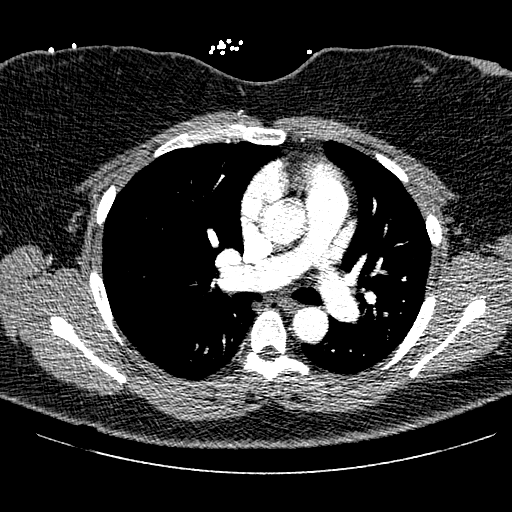
[im 236/327  lung]
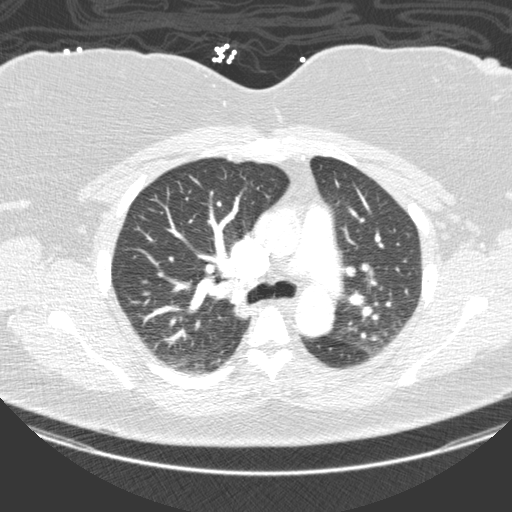
[im 254/327  mediastinal]
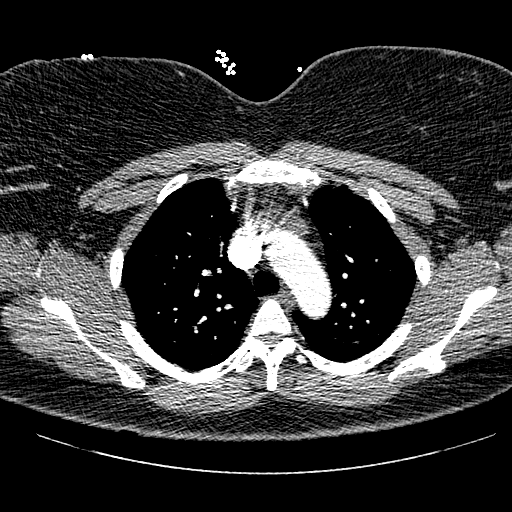
[im 272/327  lung]
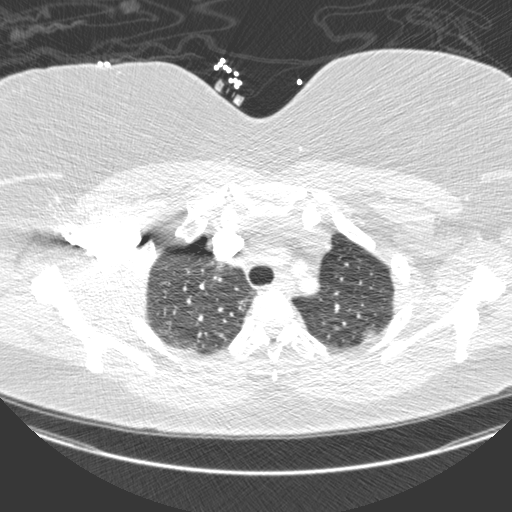
[im 290/327  mediastinal]
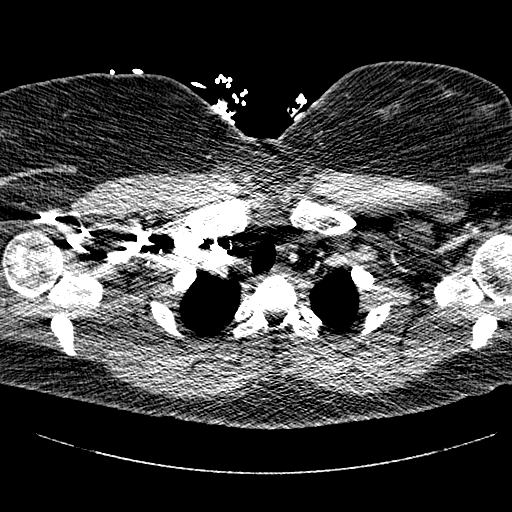
[im 308/327  lung]
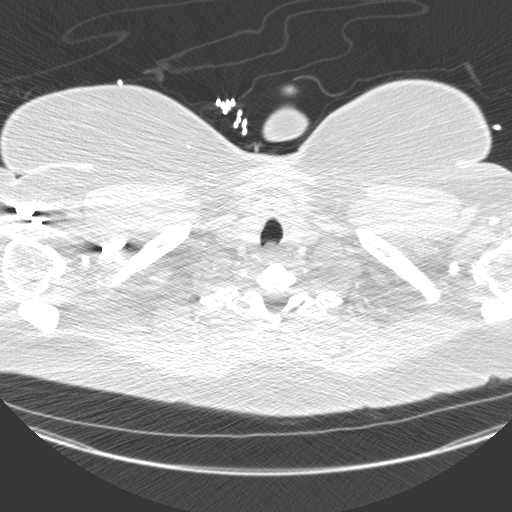

[17 of 36 positions shown; findings below may reference images not displayed]

FINDINGS: Cardiovascular: This is a technically adequate study. No pulmonary
emboli are identified. The heart and great vessels are unremarkable.
There is no evidence of thoracic aortic aneurysm.

Mediastinum/Nodes: No mediastinal mass, enlarged lymph nodes or
pericardial effusion. A small to moderate hiatal hernia is present.

Lungs/Pleura: Mild dependent and bibasilar atelectasis identified.
Mild ground-glass opacities in a mosaic type pattern suggests small
airway or small vessel disease. No airspace disease, consolidation,
nodule, mass, endobronchial/ endotracheal lesion, pleural effusion
or pneumothorax identified.

Upper Abdomen: No significant abnormalities identified.

Musculoskeletal: No acute or suspicious abnormalities.

Review of the MIP images confirms the above findings.
IMPRESSION: No evidence of pulmonary emboli or thoracic aortic aneurysm.

Mild nonspecific ground-glass opacities in a mosaic type pattern
suggesting small airway or small vessel disease.

Mild dependent and bibasilar atelectasis.

## 2017-12-06 IMAGING — DX DG CHEST 2V
2 series · 2 of 2 positions shown · non-contrast
Comparison: None.

CLINICAL DATA: Acute onset of left-sided chest pain. Initial
encounter.

EXAM:
CHEST  2 VIEW

[chest pa]
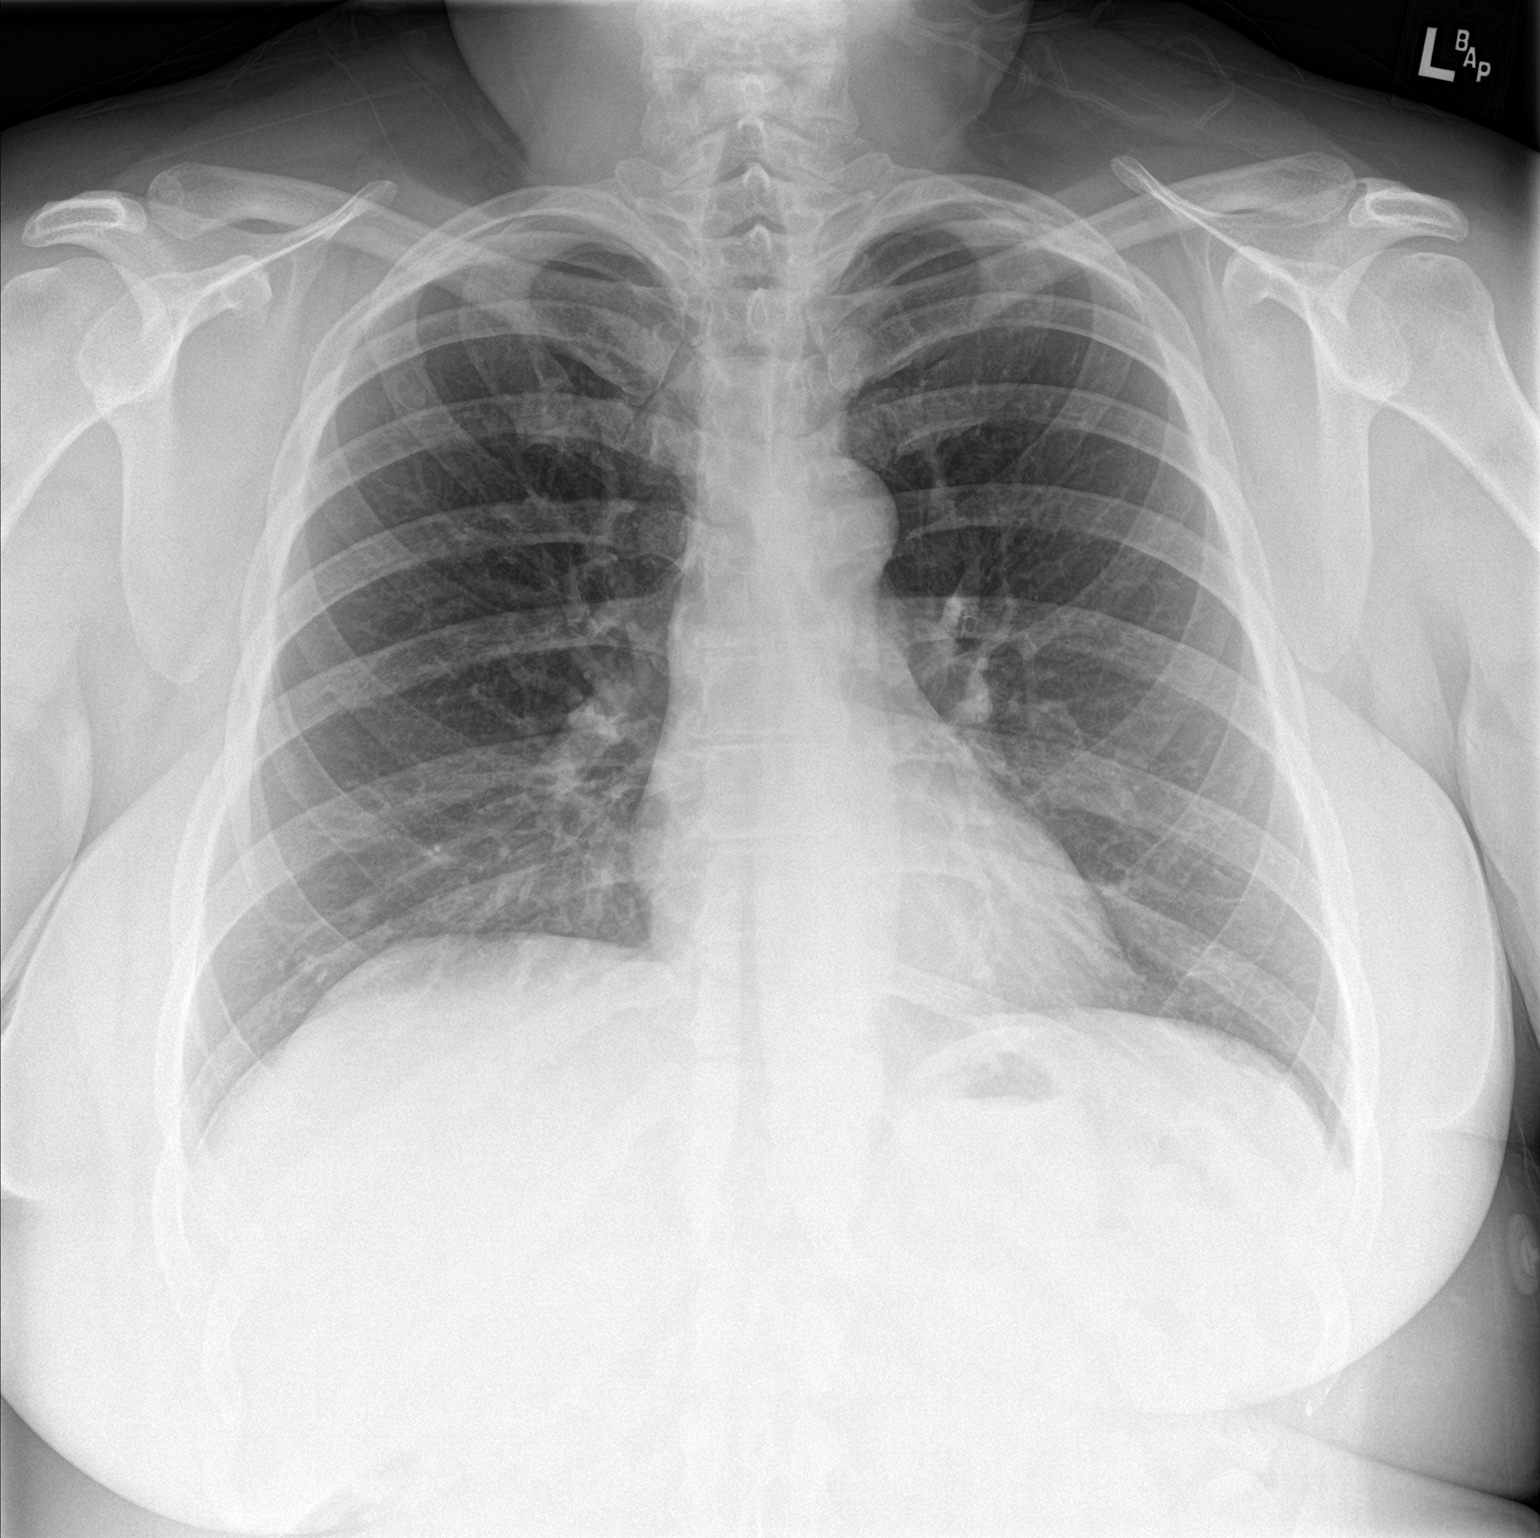

[chest lat]
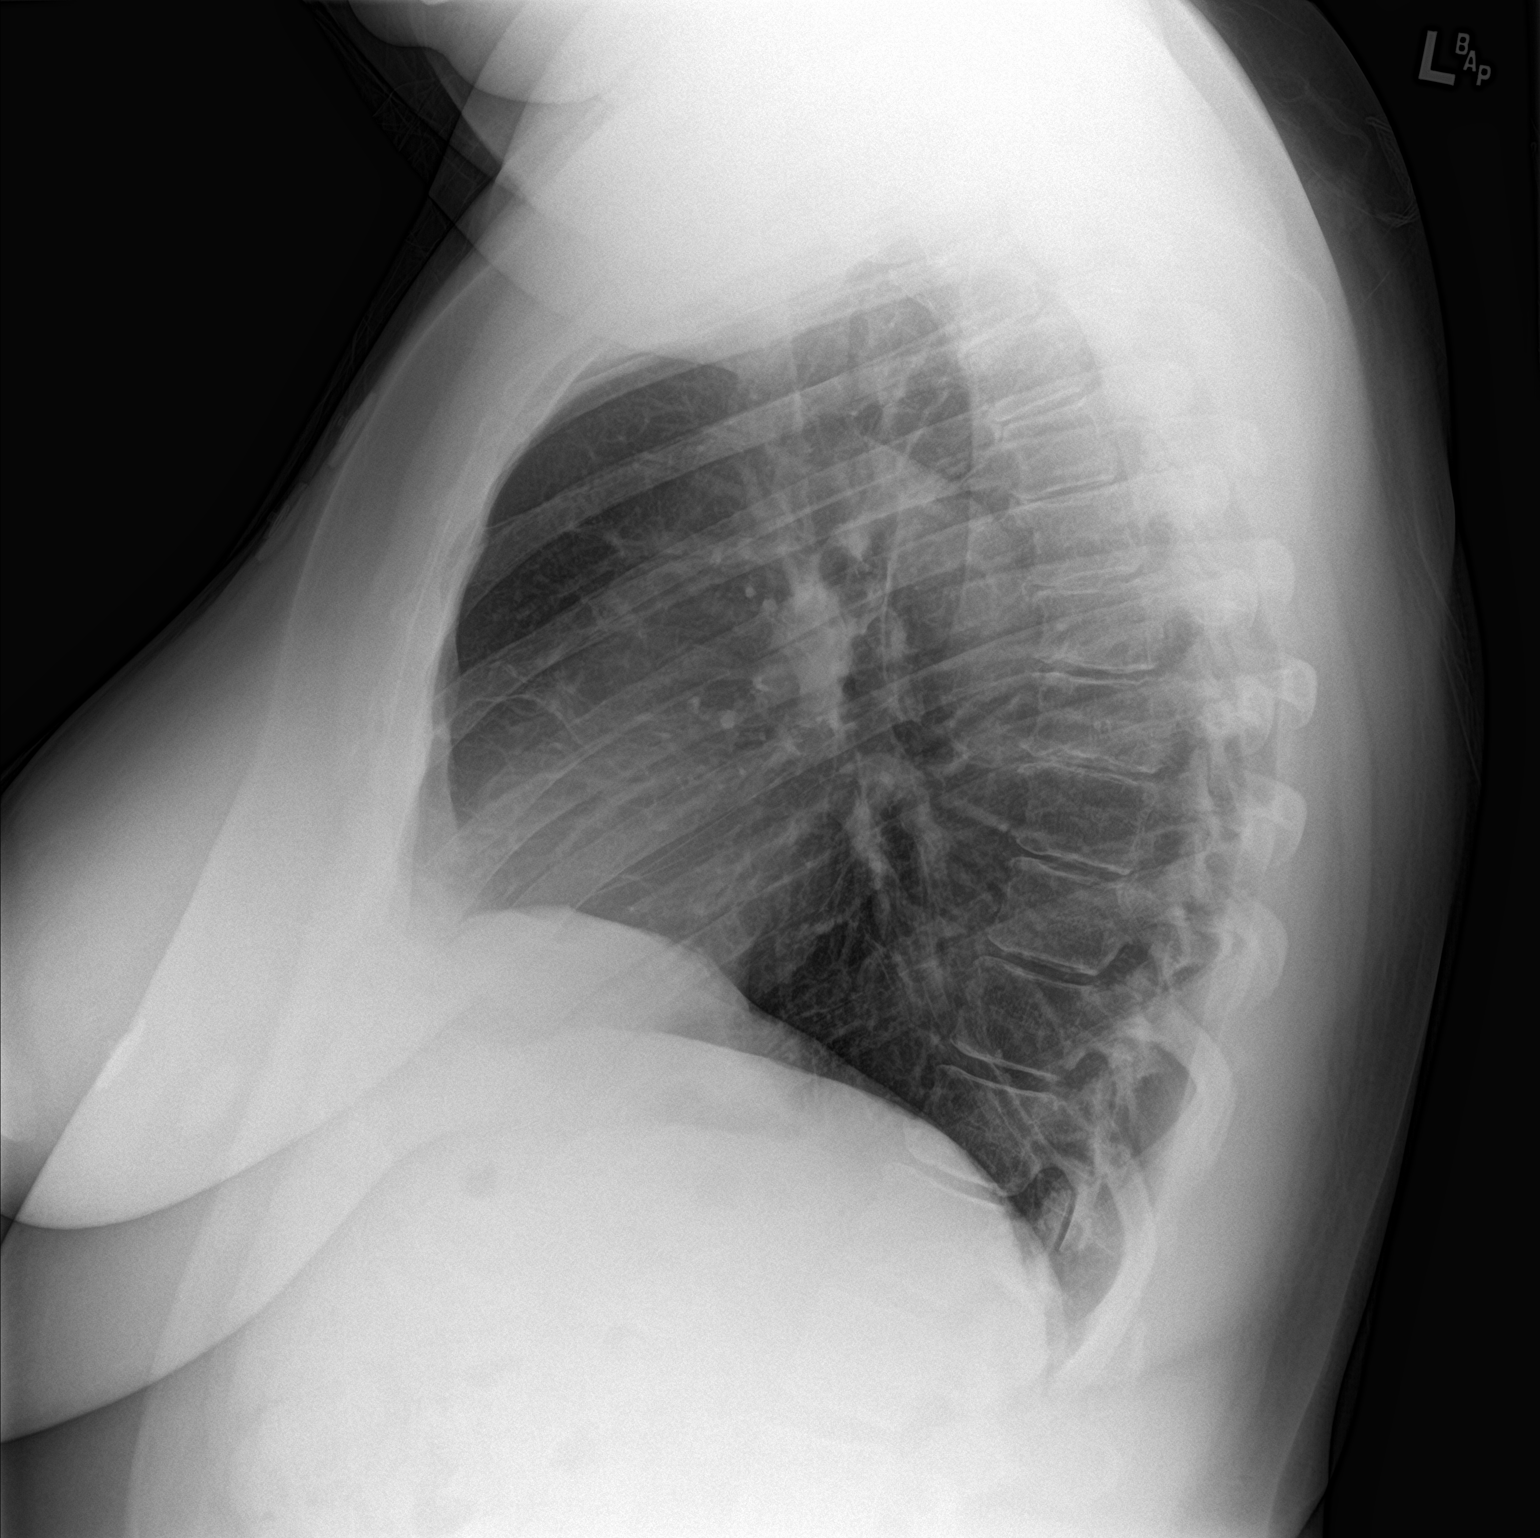

[2 of 2 positions shown; findings below may reference images not displayed]

FINDINGS: The lungs are well-aerated and clear. There is no evidence of focal
opacification, pleural effusion or pneumothorax.

The heart is normal in size; the mediastinal contour is within
normal limits. No acute osseous abnormalities are seen.
IMPRESSION: No acute cardiopulmonary process seen.
